# Patient Record
Sex: Male | Born: 1940 | Race: Black or African American | Hispanic: No | Marital: Single | Smoking: Former smoker
Health system: Southern US, Community
[De-identification: ages and names within clinical notes are randomized; demographics above are authoritative.]

## PROBLEM LIST (undated history)

## (undated) DIAGNOSIS — Z7901 Long term (current) use of anticoagulants: Secondary | ICD-10-CM

## (undated) DIAGNOSIS — I10 Essential (primary) hypertension: Secondary | ICD-10-CM

## (undated) DIAGNOSIS — Z5181 Encounter for therapeutic drug level monitoring: Secondary | ICD-10-CM

## (undated) DIAGNOSIS — D649 Anemia, unspecified: Secondary | ICD-10-CM

## (undated) DIAGNOSIS — F1410 Cocaine abuse, uncomplicated: Secondary | ICD-10-CM

## (undated) DIAGNOSIS — I480 Paroxysmal atrial fibrillation: Principal | ICD-10-CM

## (undated) DIAGNOSIS — Z0389 Encounter for observation for other suspected diseases and conditions ruled out: Secondary | ICD-10-CM

## (undated) DIAGNOSIS — Z720 Tobacco use: Secondary | ICD-10-CM

## (undated) DIAGNOSIS — I272 Pulmonary hypertension, unspecified: Secondary | ICD-10-CM

## (undated) HISTORY — DX: Encounter for observation for other suspected diseases and conditions ruled out: Z03.89

## (undated) HISTORY — DX: Pulmonary hypertension, unspecified: I27.20

## (undated) HISTORY — DX: Encounter for therapeutic drug level monitoring: Z51.81

## (undated) HISTORY — DX: Anemia, unspecified: D64.9

## (undated) HISTORY — DX: Tobacco use: Z72.0

## (undated) HISTORY — DX: Paroxysmal atrial fibrillation: I48.0

## (undated) HISTORY — DX: Long term (current) use of anticoagulants: Z79.01

## (undated) HISTORY — DX: Essential (primary) hypertension: I10

## (undated) HISTORY — DX: Cocaine abuse, uncomplicated: F14.10

---

## 2011-12-17 DEATH — deceased

## 2016-09-18 DIAGNOSIS — Z125 Encounter for screening for malignant neoplasm of prostate: Secondary | ICD-10-CM | POA: Diagnosis not present

## 2016-09-18 DIAGNOSIS — E782 Mixed hyperlipidemia: Secondary | ICD-10-CM | POA: Diagnosis not present

## 2016-09-18 DIAGNOSIS — I739 Peripheral vascular disease, unspecified: Secondary | ICD-10-CM | POA: Diagnosis not present

## 2016-09-18 DIAGNOSIS — E559 Vitamin D deficiency, unspecified: Secondary | ICD-10-CM | POA: Diagnosis not present

## 2016-09-18 DIAGNOSIS — I779 Disorder of arteries and arterioles, unspecified: Secondary | ICD-10-CM | POA: Diagnosis not present

## 2016-09-18 DIAGNOSIS — R739 Hyperglycemia, unspecified: Secondary | ICD-10-CM | POA: Diagnosis not present

## 2016-09-18 DIAGNOSIS — I4891 Unspecified atrial fibrillation: Secondary | ICD-10-CM | POA: Diagnosis not present

## 2016-09-18 DIAGNOSIS — I110 Hypertensive heart disease with heart failure: Secondary | ICD-10-CM | POA: Diagnosis not present

## 2016-09-18 DIAGNOSIS — I10 Essential (primary) hypertension: Secondary | ICD-10-CM | POA: Diagnosis not present

## 2016-09-18 DIAGNOSIS — R0602 Shortness of breath: Secondary | ICD-10-CM | POA: Diagnosis not present

## 2016-09-18 DIAGNOSIS — Z0000 Encounter for general adult medical examination without abnormal findings: Secondary | ICD-10-CM | POA: Diagnosis not present

## 2017-10-03 ENCOUNTER — Inpatient Hospital Stay (HOSPITAL_COMMUNITY)
Admission: EM | Admit: 2017-10-03 | Discharge: 2017-10-08 | DRG: 286 | Disposition: A | Discharge status: Home / Self Care | Payer: Medicare Other | Attending: Internal Medicine | Admitting: Internal Medicine

## 2017-10-03 ENCOUNTER — Emergency Department (HOSPITAL_COMMUNITY): Payer: Medicare Other

## 2017-10-03 ENCOUNTER — Encounter (HOSPITAL_COMMUNITY): Payer: Self-pay | Admitting: Emergency Medicine

## 2017-10-03 ENCOUNTER — Observation Stay (HOSPITAL_BASED_OUTPATIENT_CLINIC_OR_DEPARTMENT_OTHER): Payer: Medicare Other

## 2017-10-03 DIAGNOSIS — I4891 Unspecified atrial fibrillation: Secondary | ICD-10-CM | POA: Diagnosis not present

## 2017-10-03 DIAGNOSIS — R000 Tachycardia, unspecified: Secondary | ICD-10-CM | POA: Diagnosis not present

## 2017-10-03 DIAGNOSIS — I272 Pulmonary hypertension, unspecified: Secondary | ICD-10-CM | POA: Diagnosis present

## 2017-10-03 DIAGNOSIS — Z720 Tobacco use: Secondary | ICD-10-CM | POA: Diagnosis present

## 2017-10-03 DIAGNOSIS — Z820 Family history of epilepsy and other diseases of the nervous system: Secondary | ICD-10-CM

## 2017-10-03 DIAGNOSIS — I160 Hypertensive urgency: Secondary | ICD-10-CM | POA: Diagnosis not present

## 2017-10-03 DIAGNOSIS — R011 Cardiac murmur, unspecified: Secondary | ICD-10-CM | POA: Diagnosis present

## 2017-10-03 DIAGNOSIS — Z7901 Long term (current) use of anticoagulants: Secondary | ICD-10-CM

## 2017-10-03 DIAGNOSIS — I340 Nonrheumatic mitral (valve) insufficiency: Secondary | ICD-10-CM | POA: Diagnosis present

## 2017-10-03 DIAGNOSIS — N179 Acute kidney failure, unspecified: Secondary | ICD-10-CM | POA: Diagnosis present

## 2017-10-03 DIAGNOSIS — I361 Nonrheumatic tricuspid (valve) insufficiency: Secondary | ICD-10-CM | POA: Diagnosis not present

## 2017-10-03 DIAGNOSIS — I481 Persistent atrial fibrillation: Principal | ICD-10-CM | POA: Diagnosis present

## 2017-10-03 DIAGNOSIS — F17210 Nicotine dependence, cigarettes, uncomplicated: Secondary | ICD-10-CM | POA: Diagnosis present

## 2017-10-03 DIAGNOSIS — N289 Disorder of kidney and ureter, unspecified: Secondary | ICD-10-CM

## 2017-10-03 DIAGNOSIS — D649 Anemia, unspecified: Secondary | ICD-10-CM

## 2017-10-03 DIAGNOSIS — I5043 Acute on chronic combined systolic (congestive) and diastolic (congestive) heart failure: Secondary | ICD-10-CM | POA: Diagnosis present

## 2017-10-03 DIAGNOSIS — F1410 Cocaine abuse, uncomplicated: Secondary | ICD-10-CM | POA: Diagnosis present

## 2017-10-03 DIAGNOSIS — I509 Heart failure, unspecified: Secondary | ICD-10-CM

## 2017-10-03 DIAGNOSIS — Z8249 Family history of ischemic heart disease and other diseases of the circulatory system: Secondary | ICD-10-CM

## 2017-10-03 DIAGNOSIS — I110 Hypertensive heart disease with heart failure: Secondary | ICD-10-CM | POA: Diagnosis present

## 2017-10-03 DIAGNOSIS — IMO0001 Reserved for inherently not codable concepts without codable children: Secondary | ICD-10-CM

## 2017-10-03 DIAGNOSIS — R0602 Shortness of breath: Secondary | ICD-10-CM | POA: Diagnosis not present

## 2017-10-03 DIAGNOSIS — Z9114 Patient's other noncompliance with medication regimen: Secondary | ICD-10-CM

## 2017-10-03 DIAGNOSIS — R05 Cough: Secondary | ICD-10-CM | POA: Diagnosis not present

## 2017-10-03 DIAGNOSIS — Z0389 Encounter for observation for other suspected diseases and conditions ruled out: Secondary | ICD-10-CM

## 2017-10-03 DIAGNOSIS — R7303 Prediabetes: Secondary | ICD-10-CM | POA: Diagnosis present

## 2017-10-03 HISTORY — DX: Tobacco use: Z72.0

## 2017-10-03 HISTORY — DX: Anemia, unspecified: D64.9

## 2017-10-03 HISTORY — DX: Essential (primary) hypertension: I10

## 2017-10-03 LAB — TSH: TSH: 1.275 u[IU]/mL (ref 0.350–4.500)

## 2017-10-03 LAB — BASIC METABOLIC PANEL
ANION GAP: 10 (ref 5–15)
BUN: 15 mg/dL (ref 6–20)
CHLORIDE: 106 mmol/L (ref 101–111)
CO2: 25 mmol/L (ref 22–32)
Calcium: 8.9 mg/dL (ref 8.9–10.3)
Creatinine, Ser: 1.49 mg/dL — ABNORMAL HIGH (ref 0.61–1.24)
GFR calc non Af Amer: 44 mL/min — ABNORMAL LOW (ref >60)
GFR, EST AFRICAN AMERICAN: 52 mL/min — AB (ref >60)
Glucose, Bld: 137 mg/dL — ABNORMAL HIGH (ref 65–99)
POTASSIUM: 3.2 mmol/L — AB (ref 3.5–5.1)
Sodium: 141 mmol/L (ref 135–145)

## 2017-10-03 LAB — ECHOCARDIOGRAM COMPLETE
Ao-asc: 32 cm
FS: 20 % — AB (ref 28–44)
HEIGHTINCHES: 73 in
IVS/LV PW RATIO, ED: 1
LA ID, A-P, ES: 51 mm
LA diam index: 2.28 cm/m2
LA vol A4C: 88.5 ml
LA vol: 94.6 mL
LAVOLIN: 42.2 mL/m2
LEFT ATRIUM END SYS DIAM: 51 mm
LVOT area: 3.46 cm2
LVOT diameter: 21 mm
PW: 15 mm — AB (ref 0.6–1.1)
Weight: 3520 oz

## 2017-10-03 LAB — RAPID URINE DRUG SCREEN, HOSP PERFORMED
AMPHETAMINES: NOT DETECTED
BARBITURATES: NOT DETECTED
BENZODIAZEPINES: NOT DETECTED
COCAINE: POSITIVE — AB
Opiates: NOT DETECTED
TETRAHYDROCANNABINOL: NOT DETECTED

## 2017-10-03 LAB — CBC
HEMATOCRIT: 39 % (ref 39.0–52.0)
Hemoglobin: 12.3 g/dL — ABNORMAL LOW (ref 13.0–17.0)
MCH: 27 pg (ref 26.0–34.0)
MCHC: 31.5 g/dL (ref 30.0–36.0)
MCV: 85.7 fL (ref 78.0–100.0)
PLATELETS: 305 10*3/uL (ref 150–400)
RBC: 4.55 MIL/uL (ref 4.22–5.81)
RDW: 14.5 % (ref 11.5–15.5)
WBC: 6.5 10*3/uL (ref 4.0–10.5)

## 2017-10-03 LAB — MAGNESIUM: Magnesium: 1.7 mg/dL (ref 1.7–2.4)

## 2017-10-03 LAB — TROPONIN I
TROPONIN I: 0.17 ng/mL — AB (ref <0.03)
TROPONIN I: 0.2 ng/mL — AB (ref <0.03)
Troponin I: 0.16 ng/mL (ref <0.03)
Troponin I: 0.19 ng/mL (ref <0.03)

## 2017-10-03 LAB — HEPARIN LEVEL (UNFRACTIONATED)
HEPARIN UNFRACTIONATED: 0.13 [IU]/mL — AB (ref 0.30–0.70)
Heparin Unfractionated: 0.15 IU/mL — ABNORMAL LOW (ref 0.30–0.70)

## 2017-10-03 LAB — URINALYSIS, ROUTINE W REFLEX MICROSCOPIC
Bilirubin Urine: NEGATIVE
Glucose, UA: NEGATIVE mg/dL
Hgb urine dipstick: NEGATIVE
Ketones, ur: NEGATIVE mg/dL
Leukocytes, UA: NEGATIVE
NITRITE: NEGATIVE
PH: 6 (ref 5.0–8.0)
Protein, ur: NEGATIVE mg/dL
SPECIFIC GRAVITY, URINE: 1.005 (ref 1.005–1.030)

## 2017-10-03 LAB — HEMOGLOBIN A1C
Hgb A1c MFr Bld: 6 % — ABNORMAL HIGH (ref 4.8–5.6)
Mean Plasma Glucose: 125.5 mg/dL

## 2017-10-03 LAB — APTT: aPTT: 28 seconds (ref 24–36)

## 2017-10-03 LAB — PROTIME-INR
INR: 1.2
PROTHROMBIN TIME: 15.1 s (ref 11.4–15.2)

## 2017-10-03 LAB — BRAIN NATRIURETIC PEPTIDE: B Natriuretic Peptide: 614.9 pg/mL — ABNORMAL HIGH (ref 0.0–100.0)

## 2017-10-03 MED ORDER — DILTIAZEM HCL 100 MG IV SOLR
5.0000 mg/h | INTRAVENOUS | Status: DC
  Administered 2017-10-03: 5 mg/h via INTRAVENOUS
  Filled 2017-10-03: qty 100

## 2017-10-03 MED ORDER — DILTIAZEM HCL 30 MG PO TABS
30.0000 mg | ORAL_TABLET | Freq: Once | ORAL | Status: AC
  Administered 2017-10-03: 30 mg via ORAL
  Filled 2017-10-03: qty 1

## 2017-10-03 MED ORDER — HYDRALAZINE HCL 20 MG/ML IJ SOLN
10.0000 mg | INTRAMUSCULAR | Status: DC | PRN
  Administered 2017-10-03 – 2017-10-06 (×5): 10 mg via INTRAVENOUS
  Filled 2017-10-03 (×6): qty 1

## 2017-10-03 MED ORDER — ACETAMINOPHEN 650 MG RE SUPP
650.0000 mg | Freq: Four times a day (QID) | RECTAL | Status: DC | PRN
Start: 2017-10-03 — End: 2017-10-08

## 2017-10-03 MED ORDER — FUROSEMIDE 10 MG/ML IJ SOLN
40.0000 mg | Freq: Two times a day (BID) | INTRAMUSCULAR | Status: DC
  Administered 2017-10-03 – 2017-10-04 (×3): 40 mg via INTRAVENOUS
  Filled 2017-10-03 (×3): qty 4

## 2017-10-03 MED ORDER — ONDANSETRON HCL 4 MG/2ML IJ SOLN: 4.0000 mg | Freq: Four times a day (QID) | INTRAMUSCULAR | Status: DC | PRN

## 2017-10-03 MED ORDER — POTASSIUM CHLORIDE CRYS ER 20 MEQ PO TBCR
40.0000 meq | EXTENDED_RELEASE_TABLET | Freq: Once | ORAL | Status: AC
  Administered 2017-10-03: 20 meq via ORAL
  Filled 2017-10-03: qty 2

## 2017-10-03 MED ORDER — ACETAMINOPHEN 325 MG PO TABS
650.0000 mg | ORAL_TABLET | Freq: Four times a day (QID) | ORAL | Status: DC | PRN
Start: 2017-10-03 — End: 2017-10-08

## 2017-10-03 MED ORDER — POTASSIUM CHLORIDE CRYS ER 20 MEQ PO TBCR
40.0000 meq | EXTENDED_RELEASE_TABLET | Freq: Two times a day (BID) | ORAL | Status: AC
  Administered 2017-10-03 (×2): 40 meq via ORAL
  Filled 2017-10-03 (×2): qty 2

## 2017-10-03 MED ORDER — HEPARIN (PORCINE) IN NACL 100-0.45 UNIT/ML-% IJ SOLN
1600.0000 [IU]/h | INTRAMUSCULAR | Status: DC
  Administered 2017-10-03: 1000 [IU]/h via INTRAVENOUS
  Administered 2017-10-03: 1200 [IU]/h via INTRAVENOUS
  Administered 2017-10-05 (×2): 1700 [IU]/h via INTRAVENOUS
  Administered 2017-10-06: 1600 [IU]/h via INTRAVENOUS
  Filled 2017-10-03 (×6): qty 250

## 2017-10-03 MED ORDER — ONDANSETRON HCL 4 MG PO TABS
4.0000 mg | ORAL_TABLET | Freq: Four times a day (QID) | ORAL | Status: DC | PRN
Start: 2017-10-03 — End: 2017-10-08

## 2017-10-03 MED ORDER — DILTIAZEM HCL 60 MG PO TABS
60.0000 mg | ORAL_TABLET | Freq: Two times a day (BID) | ORAL | Status: DC
  Administered 2017-10-03 – 2017-10-07 (×9): 60 mg via ORAL
  Filled 2017-10-03 (×10): qty 1

## 2017-10-03 MED ORDER — DILTIAZEM LOAD VIA INFUSION
20.0000 mg | Freq: Once | INTRAVENOUS | Status: AC
  Administered 2017-10-03: 20 mg via INTRAVENOUS
  Filled 2017-10-03: qty 20

## 2017-10-03 MED ORDER — HEPARIN (PORCINE) IN NACL 100-0.45 UNIT/ML-% IJ SOLN: 1000.0000 [IU]/h | INTRAMUSCULAR | Status: DC

## 2017-10-03 MED ORDER — HEPARIN BOLUS VIA INFUSION
4000.0000 [IU] | Freq: Once | INTRAVENOUS | Status: AC
  Administered 2017-10-03: 4000 [IU] via INTRAVENOUS
  Filled 2017-10-03: qty 4000

## 2017-10-03 MED ORDER — FUROSEMIDE 10 MG/ML IJ SOLN
20.0000 mg | Freq: Once | INTRAMUSCULAR | Status: AC
  Administered 2017-10-03: 20 mg via INTRAVENOUS
  Filled 2017-10-03: qty 2

## 2017-10-03 NOTE — Consult Note (Signed)
Cardiology Consultation:   Patient ID: Adam Willis; [id number]; 02-25-1941   Admit date: 10/03/2017 Date of Consult: 10/03/2017  Primary Care Provider: [facility], [facility] Primary Cardiologist: Adam Willis Primary Electrophysiologist:  n/a   Patient Profile:   Adam Willis is a 76 y.o. male with a hx of murmur, HTN who is being seen today for the evaluation of SOB and atrial fib at the request of Dr Adam Willis.  History of Present Illness:   Adam Willis was in his usual state of health until about 2 weeks ago.  He started noticing increased dyspnea on exertion. He would also noticed that when he was exerting himself, his heart rate was elevated.   He did not have any chest pain. He has no history of chest pain. He has never had palpitations or dyspnea on exertion like this before. He has no bleeding issues. He does keep appointments with his PCP, but has not had a checkup this year. He does not know when he last had fasting labs done.  He has high blood pressure and several family members have/had high blood pressure, but he does not know what his blood pressure runs at home. He has not been having headaches or visual disturbances.  At rest, he would did not notice an irregular heartbeat. He did not have lower extremity edema. He has some mild orthopnea but no PND. He would rest and try to calm himself down and he would feel his heart rate and breathing improve. He did not have presyncope or syncope.  The symptoms gradually worsened and he finally came to the hospital this a.m.  n the ER, he was in heart failure and rapid atrial fibrillation. He was started on IV Cardizem with improvement in his heart rate. Once his heart rate improved, he was changed to oral Cardizem area of his heart rate is currently controlled.  He was given Lasix 20 mg IV and started on oxygen. His breathing has improved.  He was significantly hypertensive on admission with a blood pressure of  202/121. In addition to the Cardizem, he was given hydralazine 10 mg. His blood pressure is elevated but improved.  He is on heparin currently for anticoagulation. He has Medicare but does not have a part D and does not have prescription coverage.   Past Medical History:  Diagnosis Date  . Heart murmur   . Hypertension     History reviewed. No pertinent surgical history.   Inpatient Medications: Scheduled Meds: . diltiazem  60 mg Oral Q12H   Continuous Infusions: . heparin 1,000 Units/hr (10/03/17 0234)   PRN Meds: acetaminophen **OR** acetaminophen, hydrALAZINE, ondansetron **OR** ondansetron (ZOFRAN) IV Prior to Admission medications   Medication Sig Start Date End Date Taking? Authorizing Provider  amLODipine (NORVASC) 5 MG tablet Take 5 mg by mouth daily.   Yes [provider]  aspirin 325 MG tablet Take 325 mg by mouth daily.   Yes [provider]  atorvastatin (LIPITOR) 20 MG tablet Take 20 mg by mouth daily.   Yes [provider]  carvedilol (COREG) 6.25 MG tablet Take 6.25 mg by mouth 2 (two) times daily with a meal.   Yes [provider]  famotidine (PEPCID AC) 10 MG chewable tablet Chew 10 mg by mouth 2 (two) times daily.   Yes [provider]  lisinopril (PRINIVIL,ZESTRIL) 20 MG tablet Take 20 mg by mouth daily.   Yes [provider]    Allergies:   No Known Allergies  Social  History:   Social History   Social History  . Marital status: Single    Spouse name: N/A  . Number of children: N/A  . Years of education: N/A   Occupational History  . 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 Smoking status: Current Every Day Smoker    Types: Cigarettes  . Smokeless tobacco: Never Used     Comment: cut back to 1-2/day in 2016  . Alcohol use No  . Drug use: No  . Sexual activity: Not on file   Other Topics Concern  . Not on file   Social History Narrative   [place] He has a fiance.    Family History:   The patient's family history includes Alzheimer's disease (age of onset: 5886) in his mother; CAD (age of onset: 6470) in his father; Hypertension in his brother. Pt indicated that his mother is deceased. He indicated that his father is deceased. He indicated that his brother is alive.    ROS:  Please see the history of present illness.  All other ROS reviewed and negative.      Physical Exam/Data:   Vitals:   10/03/17 0900 10/03/17 0930 10/03/17 1000 10/03/17 1030  BP: (!) 141/78 (!) 145/94 (!) 164/91 (!) 163/94  Pulse: 66 (!) 48 (!) 57 63  Resp: (!) 29 20 19 20   SpO2: 98% 97% 98% 98%  Weight:      Height:       No intake or output data in the 24 hours ending 10/03/17 1133 Filed Weights   10/03/17 0111  Weight: 220 lb (99.8 kg)   Body mass index is 29.03 kg/m.  General:  Well nourished, well developed, in no acute distress HEENT: normal Lymph: no adenopathy Neck: JVD to jaw Endocrine:  No thryomegaly Vascular: No carotid bruits; 4/4 extremity pulses 2+ bilaterally  Cardiac:  normal S1, S2; RRR; soft systolic murmur  Lungs:  Good air exchange with basilar Rales bilaterally, no wheezing, rhonchi  Abd: soft, nontender, no hepatomegaly  Ext: no edema Musculoskeletal:  No deformities, BUE and BLE strength normal and equal Skin: warm and dry  Neuro:  CNs 2-12 intact, no focal abnormalities noted Psych:  Normal affect   EKG:  The EKG was personally reviewed and demonstrates:  10/19 at 0 1:12 AM ECG is atrial fibrillation with rapid ventricular response, heart rate 123; repeat ECG at 3:35 AM his atrial fibrillation, heart rate 51, LVH Telemetry:  Telemetry was personally reviewed and demonstrates:  Atrial fib, heart rate currently controlled  Relevant CV Studies:  Echo ordered  Laboratory Data:  Chemistry  Recent Labs Lab 10/03/17 0115  NA 141  K 3.2*  CL 106  CO2 25  GLUCOSE 137*  BUN 15  CREATININE 1.49*    CALCIUM 8.9  GFRNONAA 44*  GFRAA 52*  ANIONGAP 10    Hematology  Recent Labs Lab 10/03/17 0115  WBC 6.5  RBC 4.55  HGB 12.3*  HCT 39.0  MCV 85.7  MCH 27.0  MCHC 31.5  RDW 14.5  PLT 305   Cardiac Enzymes  Recent Labs Lab 10/03/17 0115 10/03/17 0536  TROPONINI 0.16* 0.17*     BNP  Recent Labs Lab 10/03/17 0115  BNP 614.9*     Radiology/Studies:  Dg Chest Port 1 View  Result Date: 10/03/2017 CLINICAL DATA:  76 y/o M; history fibrillation, shortness of breath, dry cough, chest congestion. EXAM: PORTABLE CHEST 1  VIEW COMPARISON:  None. FINDINGS: Enlarged cardiac silhouette. Transcutaneous pacing pads noted. Reticular markings and peripheral lines compatible with interstitial pulmonary edema. No consolidation, effusion, or pneumothorax identified. Bones are unremarkable. IMPRESSION: Enlarged cardiac silhouette.  Interstitial pulmonary edema. Electronically Signed   By: Mitzi Hansen M.D.   On: 10/03/2017 01:28    Assessment and Plan:   Principal Problem: 1.  Atrial fibrillation with RVR (HCC) - heart rate improved on Cardizem, but if EF is down, will need to consider changing this to a beta blocker - Duration is at least a week, if cardioversion is indicated, we'll have to do TEE first - When heart rate is controlled, he is asymptomatic. Continue rate control for now  Active Problems: 2.  Acute CHF (congestive heart failure) (HCC) - echocardiogram ordered. - He needs additional diuresis, we'll have to follow his renal function carefully - f/u on echo  3.  Acute renal failure (ARF) (HCC) - per IM - no labs in the system, nothing in Care Everywhere. - He has not had recent labs drawn. - he was not aware of any problem with his renal function  4.  Normocytic normochromic anemia - No history of bleeding per patient - Per IM  5.  Tobacco abuse - Per IM, cessation encouraged  6.  Hypertensive urgency - Per IM, improved  7. Anticoagulation: -  This patients CHA2DS2-VASc Score and unadjusted Ischemic Stroke Rate (% per year) is equal to 3.2 % stroke rate/year from a score of 3 Above score calculated as 1 point each if present [CHF, HTN, DM, Vascular=MI/PAD/Aortic Plaque, Age if 65-74, or Male], 2 points each if present [Age > 75, or Stroke/TIA/TE] - Currently on heparin, changed to Eliquis when we are sure there is no need for invasive workup  8. Elevated troponin - Mild elevation in troponin with no significant trend. - this is more consistent with CHF than ACS - continue to cycle   Signed, Theodore Demark, PA-C  10/03/2017 11:33 AM

## 2017-10-03 NOTE — Progress Notes (Signed)
ANTICOAGULATION CONSULT NOTE  Pharmacy Consult for heparin Indication: atrial fibrillation  No Known Allergies  Patient Measurements: Height: 6\' 3"  (190.5 cm) Weight: 225 lb 4.8 oz (102.2 kg) IBW/kg (Calculated) : 84.5  Vital Signs: Temp: 98.7 F (37.1 C) (10/19 2142) Temp Source: Oral (10/19 2142) BP: 158/94 (10/19 2142) Pulse Rate: 82 (10/19 2142)  Labs:  Recent Labs  10/03/17 0115 10/03/17 0122 10/03/17 0536 10/03/17 1124 10/03/17 1756 10/03/17 2119  HGB 12.3*  --   --   --   --   --   HCT 39.0  --   --   --   --   --   PLT 305  --   --   --   --   --   APTT  --  28  --   --   --   --   LABPROT  --  15.1  --   --   --   --   INR  --  1.20  --   --   --   --   HEPARINUNFRC  --   --   --  0.13*  --  0.15*  CREATININE 1.49*  --   --   --   --   --   TROPONINI 0.16*  --  0.17* 0.20* 0.19*  --     Assessment: 76yo male with new onset atrial fibrillation on IV heparin per pharmacy dosing consult.   Heparin level remains sub-therapeutic at 0.15 on 1200 units/hr.  No bleeding reported. No issues with line noted.   Goal of Therapy:  Heparin level 0.3-0.7 units/ml Monitor platelets by anticoagulation protocol: Yes   Plan:  Increase heparin to 1500 units/hr Daily heparin level and CBC Follow for s/s bleeding and cards plans- plans for change to Eliquis when no further invasive workup indicated  Link Snuffer, PharmD, BCPS Clinical Pharmacist Clinical phone 10/03/2017 until 11PM - [phone] After hours, please call #[phone] 10/03/2017 10:20 PM

## 2017-10-03 NOTE — ED Notes (Addendum)
Dr. Rhunette Croft discontinued BIPAP , pt. placed on 4 LPM/ , pt. waiting for admitting MD . Denies chest pain /respirations unlabored . Heparin drip infusing .

## 2017-10-03 NOTE — ED Triage Notes (Addendum)
Patient arrived with EMS from home reports worsening SOB , dry cough and chest congestion onset yesterday afternoon . He received 2 doses of Duoneb treatment by EMS prior to arrival with no relief . Pt. is hypertensive and has not taken his antihypertensive medications for several weeks .

## 2017-10-03 NOTE — Progress Notes (Signed)
  Echocardiogram 2D Echocardiogram has been performed.  Adam Willis 10/03/2017, 12:41 PM

## 2017-10-03 NOTE — ED Provider Notes (Signed)
[facility] EMERGENCY DEPARTMENT Provider Note   CSN: [id number] Arrival date & time: 10/03/17  0110     History   Chief Complaint Chief Complaint  Patient presents with  . Shortness of Breath    HPI Adam Willis is a 76 y.o. male.  HPI Level 5 caveat for respiratory distress. Patient with history of high blood pressure, heart murmur and noncompliance with his blood pressure medicine for the past 1 week comes in with chief complaint of acute shortness of breath.  Patient reports that he started having some shortness of breath this afternoon that progressed overnight.  Patient called EMS who noted some wheezing initially and rales in route.  Patient was given DuoNeb and started on BiPAP.  Patient is noted to be tachycardic and in A. Fib.  He is also hypertensive.  Patient denies any history of A. fib and he has no active chest pain.  Upon further questioning patient reports that he is noting some palpitations.  Patient thinks that he has had some exertional dyspnea for 1 week and intermittently noted some palpitations as well.  Patient denies any history of PE/DVT and has no risk factors for it.  Patient denies heavy smoking or any drug use.  Past Medical History:  Diagnosis Date  . Heart murmur   . Hypertension     Patient Active Problem List   Diagnosis Date Noted  . Atrial fibrillation with RVR (HCC) 10/03/2017  . Acute CHF (congestive heart failure) (HCC) 10/03/2017  . Acute renal failure (ARF) (HCC) 10/03/2017  . Normocytic normochromic anemia 10/03/2017  . Tobacco abuse 10/03/2017  . Hypertensive urgency 10/03/2017    History reviewed. No pertinent surgical history.     Home Medications    Prior to Admission medications   Not on File    Family History Family History  Problem Relation Age of Onset  . Hypertension Brother     Social History Social History  Substance Use Topics  . Smoking status: Never Smoker  . Smokeless  tobacco: Never Used  . Alcohol use No     Allergies   Patient has no known allergies.   Review of Systems Review of Systems  Unable to perform ROS: Severe respiratory distress     Physical Exam Updated Vital Signs BP (!) 161/101   Pulse 68   Resp 17   Ht 6\' 1"  (1.854 m)   Wt 99.8 kg (220 lb)   SpO2 98%   BMI 29.03 kg/m   Physical Exam  Constitutional: He is oriented to person, place, and time. He appears well-developed.  HENT:  Head: Atraumatic.  Neck: Neck supple.  Cardiovascular: Intact distal pulses.   Tachycardia, irregular  Pulmonary/Chest: Effort normal. He has rales.  Abdominal: Soft.  Musculoskeletal: He exhibits edema. He exhibits no tenderness.  Neurological: He is alert and oriented to person, place, and time.  Skin: Skin is warm.  Nursing note and vitals reviewed.    ED Treatments / Results  Labs (all labs ordered are listed, but only abnormal results are displayed) Labs Reviewed  BASIC METABOLIC PANEL - Abnormal; Notable for the following:       Result Value   Potassium 3.2 (*)    Glucose, Bld 137 (*)    Creatinine, Ser 1.49 (*)    GFR calc non Af Amer 44 (*)    GFR calc Af Amer 52 (*)    All other components within normal limits  CBC - Abnormal; Notable for the following:  Hemoglobin 12.3 (*)    All other components within normal limits  TROPONIN I - Abnormal; Notable for the following:    Troponin I 0.16 (*)    All other components within normal limits  BRAIN NATRIURETIC PEPTIDE - Abnormal; Notable for the following:    B Natriuretic Peptide 614.9 (*)    All other components within normal limits  TROPONIN I - Abnormal; Notable for the following:    Troponin I 0.17 (*)    All other components within normal limits  HEMOGLOBIN A1C - Abnormal; Notable for the following:    Hgb A1c MFr Bld 6.0 (*)    All other components within normal limits  MAGNESIUM  PROTIME-INR  APTT  TSH  HEPARIN LEVEL (UNFRACTIONATED)  TROPONIN I  TROPONIN I   URINALYSIS, ROUTINE W REFLEX MICROSCOPIC  RAPID URINE DRUG SCREEN, HOSP PERFORMED    EKG  EKG Interpretation  Date/Time:  Friday October 03 2017 01:12:24 EDT Ventricular Rate:  123 PR Interval:    QRS Duration: 97 QT Interval:  334 QTC Calculation: 478 R Axis:   86 Text Interpretation:  Atrial fibrillation Borderline right axis deviation Probable left ventricular hypertrophy Borderline prolonged QT interval No acute changes AFIB IS NEW - but no old ekg to compare Confirmed by Derwood Kaplananavati, Nickey Canedo ([id number]) on 10/03/2017 1:57:56 AM       Radiology Dg Chest Port 1 View  Result Date: 10/03/2017 CLINICAL DATA:  76 y/o M; history fibrillation, shortness of breath, dry cough, chest congestion. EXAM: PORTABLE CHEST 1 VIEW COMPARISON:  None. FINDINGS: Enlarged cardiac silhouette. Transcutaneous pacing pads noted. Reticular markings and peripheral lines compatible with interstitial pulmonary edema. No consolidation, effusion, or pneumothorax identified. Bones are unremarkable. IMPRESSION: Enlarged cardiac silhouette.  Interstitial pulmonary edema. Electronically Signed   By: Mitzi HansenLance  Furusawa-Stratton M.D.   On: 10/03/2017 01:28    Procedures Procedures (including critical care time) CRITICAL CARE Performed by: Derwood KaplanNanavati, Shayma Pfefferle   Total critical care time: 53 minutes  Critical care time was exclusive of separately billable procedures and treating other patients.  Critical care was necessary to treat or prevent imminent or life-threatening deterioration.  Critical care was time spent personally by me on the following activities: development of treatment plan with patient and/or surrogate as well as nursing, discussions with consultants, evaluation of patient's response to treatment, examination of patient, obtaining history from patient or surrogate, ordering and performing treatments and interventions, ordering and review of laboratory studies, ordering and review of radiographic studies, pulse  oximetry and re-evaluation of patient's condition.\  Medications Ordered in ED Medications  heparin ADULT infusion 100 units/mL (25000 units/28450mL sodium chloride 0.45%) (1,000 Units/hr Intravenous New Bag/Given 10/03/17 0234)  diltiazem (CARDIZEM) tablet 60 mg (not administered)  acetaminophen (TYLENOL) tablet 650 mg (not administered)    Or  acetaminophen (TYLENOL) suppository 650 mg (not administered)  ondansetron (ZOFRAN) tablet 4 mg (not administered)    Or  ondansetron (ZOFRAN) injection 4 mg (not administered)  hydrALAZINE (APRESOLINE) injection 10 mg (10 mg Intravenous Given 10/03/17 0652)  diltiazem (CARDIZEM) 1 mg/mL load via infusion 20 mg (20 mg Intravenous Bolus from Bag 10/03/17 0158)  heparin bolus via infusion 4,000 Units (4,000 Units Intravenous Bolus from Bag 10/03/17 0234)  diltiazem (CARDIZEM) tablet 30 mg (30 mg Oral Given 10/03/17 0404)  furosemide (LASIX) injection 20 mg (20 mg Intravenous Given 10/03/17 0622)  potassium chloride SA (K-DUR,KLOR-CON) CR tablet 40 mEq (20 mEq Oral Given 10/03/17 0622)     Initial Impression / Assessment and Plan /  ED Course  I have reviewed the triage vital signs and the nursing notes.  Pertinent labs & imaging results that were available during my care of the patient were reviewed by me and considered in my medical decision making (see chart for details).  Clinical Course as of Oct 03 806  Fri Oct 03, 2017  0350 Both the heart rate and the blood pressure have improved.  Diltiazem drip will be discontinued and patient will be given an oral diltiazem pill.  We will also discontinue the BiPAP shortly.  [AN]    Clinical Course User Index [AN] Derwood Kaplan, MD    Patient comes in with chief complaint of shortness of breath.  Patient is noted to be in A. fib with RVR.  He has history of high blood pressure, A. fib appears to be new.  Based on history it is unclear as to the onset of A. fib, and it is possible that patient has  been in A. fib for close to a week now -since he explains that he has been having exertional dyspnea and some palpitations for a week now.  At arrival patient is on BiPAP, and he is in decompensated CHF.  With BiPAP patient felt a lot better, and so we decided to treat patient with diltiazem drip.  Over time patient's heart rate improved, diltiazem drip was stopped and patient was given oral dilt.  This patients CHA2DS2-VASc Score and unadjusted Ischemic Stroke Rate (% per year) is equal to 2.2 % stroke rate/year from a score of 2  Above score calculated as 1 point each if present [CHF, HTN, DM, Vascular=MI/PAD/Aortic Plaque, Age if 65-74, or Male] Above score calculated as 2 points each if present [Age > 75, or Stroke/TIA/TE]  Heparin drip initiated.  Cardiology on consult, medicine to admit.  Final Clinical Impressions(s) / ED Diagnoses   Final diagnoses:  Atrial fibrillation with RVR (HCC)  Acute congestive heart failure, unspecified heart failure type [facility])    New Prescriptions New Prescriptions   No medications on file     Derwood Kaplan, MD 10/03/17 [phone]

## 2017-10-03 NOTE — ED Notes (Signed)
Called lab to add on BNP and INR.

## 2017-10-03 NOTE — Progress Notes (Signed)
ANTICOAGULATION CONSULT NOTE - Initial Consult  Pharmacy Consult for heparin Indication: atrial fibrillation  No Known Allergies  Patient Measurements: Height: 6\' 1"  (185.4 cm) Weight: 220 lb (99.8 kg) IBW/kg (Calculated) : 79.9  Vital Signs: BP: 172/129 (10/19 0200) Pulse Rate: 76 (10/19 0200)  Labs:  Recent Labs  10/03/17 0115  HGB 12.3*  HCT 39.0  PLT 305    Medical History: Past Medical History:  Diagnosis Date  . Heart murmur   . Hypertension     Assessment: 76yo male c/o worsening SOB, dry cough, and chest congestion since yesterday, EMS gave DuoNeb treatments w/ no relief, found to be in Afib, to begin heparin.  Goal of Therapy:  Heparin level 0.3-0.7 units/ml Monitor platelets by anticoagulation protocol: Yes   Plan:  Will give heparin 4000 units IV bolus x1 followed by gtt at 1000 units/hr and monitor heparin levels and CBC.  Vernard Gambles, PharmD, BCPS  10/03/2017,2:09 AM

## 2017-10-03 NOTE — ED Notes (Signed)
Dr. Toniann Fail ( hospitalist) at bedside evaluating pt.

## 2017-10-03 NOTE — Progress Notes (Signed)
ANTICOAGULATION CONSULT NOTE  Pharmacy Consult for heparin Indication: atrial fibrillation  No Known Allergies  Patient Measurements: Height: 6\' 1"  (185.4 cm) Weight: 220 lb (99.8 kg) IBW/kg (Calculated) : 79.9  Vital Signs: BP: 156/92 (10/19 1100) Pulse Rate: 74 (10/19 1100)  Labs:  Recent Labs  10/03/17 0115 10/03/17 0122 10/03/17 0536 10/03/17 1124  HGB 12.3*  --   --   --   HCT 39.0  --   --   --   PLT 305  --   --   --   APTT  --  28  --   --   LABPROT  --  15.1  --   --   INR  --  1.20  --   --   HEPARINUNFRC  --   --   --  0.13*  CREATININE 1.49*  --   --   --   TROPONINI 0.16*  --  0.17* 0.20*    Assessment: 76yo male c/o worsening SOB, dry cough, and chest congestion since yesterday, EMS gave DuoNeb treatments w/ no relief, found to be in Afib, to begin heparin.  Initial heparin level low at 0.13units/mL. No issues with line per RN Shanda Bumps.  Goal of Therapy:  Heparin level 0.3-0.7 units/ml Monitor platelets by anticoagulation protocol: Yes   Plan:  Increase heparin to 1200 units/hr Heparin level in 8 hours Daily heparin level and CBC Follow for s/s bleeding and cards plans- plans for change to Eliquis when no further invasive workup indicated  Legacy Lacivita D. Kolleen [facility], PharmD, BCPS Clinical Pharmacist Pager: [phone] Clinical Phone for 10/03/2017 until 3:30pm: [phone] If after 3:30pm, please call main pharmacy at [phone] 10/03/2017 12:37 PM

## 2017-10-03 NOTE — ED Notes (Signed)
Patient given Pottasium 40 meq po .

## 2017-10-03 NOTE — Care Management Obs Status (Signed)
MEDICARE OBSERVATION STATUS NOTIFICATION   Patient Details  Name: Adam Willis MRN: [id number] Date of Birth: 21-Nov-1941   Medicare Observation Status Notification Given:  Yes    Lawerance Sabal, RN 10/03/2017, 1:44 PM

## 2017-10-03 NOTE — H&P (Signed)
History and Physical    Adam CedarKenneth Petruska LKG:[id number] DOB: [birth year] DOA: 10/03/2017  PCP: [facility]  Patient coming from: Home.  Chief Complaint: Shortness of breath.  HPI: Adam Willis is a 76 y.o. male with history of hypertension and possible CHF presents with the complaints of increasing shortness of breath and palpitations.  Patient states over the last 1 week patient has been having increasing exertional shortness of breath but he denies any chest pain.  Last week he had an episode of palpitation which resolved without any intervention.  ED Course: In the ER patient was found to be hypertensive with blood pressure systolic more than 200.  Patient was in A. fib with RVR.  Chest x-ray shows congestion and enlarged cardiac silhouette.  BNP was 614 and troponin was 0.16.  Patient was started on Cardizem infusion heparin and BiPAP.  Patient converted back to sinus rhythm with shortness of breath improving on BiPAP was removed.  The patient is being admitted for further observation.  Review of Systems: As per HPI, rest all negative.   Past Medical History:  Diagnosis Date  . Heart murmur   . Hypertension     History reviewed. No pertinent surgical history.   reports that he has never smoked. He has never used smokeless tobacco. He reports that he does not drink alcohol or use drugs.  No Known Allergies  Family History  Problem Relation Age of Onset  . Hypertension Brother     Prior to Admission medications   Not on File    Physical Exam: Vitals:   10/03/17 0530 10/03/17 0545 10/03/17 0600 10/03/17 0615  BP: (!) 150/95 (!) 173/119 (!) 148/93 (!) 161/93  Pulse: (!) 42 60 (!) 51 (!) 59  Resp: 20 (!) 24 16 20   SpO2: 99% 99% 98% 98%  Weight:      Height:          Constitutional: Moderately built and nourished. Vitals:   10/03/17 0530 10/03/17 0545 10/03/17 0600 10/03/17 0615  BP: (!) 150/95 (!) 173/119 (!) 148/93 (!) 161/93  Pulse: (!) 42 60 (!)  51 (!) 59  Resp: 20 (!) 24 16 20   SpO2: 99% 99% 98% 98%  Weight:      Height:       Eyes: Anicteric no pallor. ENMT: No discharge from the ears eyes nose and mouth. Neck: JVD mildly elevated no mass felt. Respiratory: Mild expiratory wheezes and crepitations. Cardiovascular: S1-S2 heard no murmurs appreciated. Abdomen: Soft nontender bowel sounds present. Musculoskeletal: No edema. Skin: No rash. Neurologic: Alert awake oriented to time place and person.  Moves all extremities. Psychiatric: Appears normal.  Normal affect.   Labs on Admission: I have personally reviewed following labs and imaging studies  CBC:  Recent Labs Lab 10/03/17 0115  WBC 6.5  HGB 12.3*  HCT 39.0  MCV 85.7  PLT 305   Basic Metabolic Panel:  Recent Labs Lab 10/03/17 0115  NA 141  K 3.2*  CL 106  CO2 25  GLUCOSE 137*  BUN 15  CREATININE 1.49*  CALCIUM 8.9  MG 1.7   GFR: Estimated Creatinine Clearance: 54.1 mL/min (A) (by C-G formula based on SCr of 1.49 mg/dL (H)). Liver Function Tests: No results for input(s): AST, ALT, ALKPHOS, BILITOT, PROT, ALBUMIN in the last 168 hours. No results for input(s): LIPASE, AMYLASE in the last 168 hours. No results for input(s): AMMONIA in the last 168 hours. Coagulation Profile:  Recent Labs Lab 10/03/17 0122  INR 1.20  Cardiac Enzymes:  Recent Labs Lab 10/03/17 0115  TROPONINI 0.16*   BNP (last 3 results) No results for input(s): PROBNP in the last 8760 hours. HbA1C: No results for input(s): HGBA1C in the last 72 hours. CBG: No results for input(s): GLUCAP in the last 168 hours. Lipid Profile: No results for input(s): CHOL, HDL, LDLCALC, TRIG, CHOLHDL, LDLDIRECT in the last 72 hours. Thyroid Function Tests: No results for input(s): TSH, T4TOTAL, FREET4, T3FREE, THYROIDAB in the last 72 hours. Anemia Panel: No results for input(s): VITAMINB12, FOLATE, FERRITIN, TIBC, IRON, RETICCTPCT in the last 72 hours. Urine analysis: No results  found for: COLORURINE, APPEARANCEUR, LABSPEC, PHURINE, GLUCOSEU, HGBUR, BILIRUBINUR, KETONESUR, PROTEINUR, UROBILINOGEN, NITRITE, LEUKOCYTESUR Sepsis Labs: @LABRCNTIP (procalcitonin:4,lacticidven:4) )No results found for this or any previous visit (from the past 240 hour(s)).   Radiological Exams on Admission: Dg Chest Port 1 View  Result Date: 10/03/2017 CLINICAL DATA:  76 y/o M; history fibrillation, shortness of breath, dry cough, chest congestion. EXAM: PORTABLE CHEST 1 VIEW COMPARISON:  None. FINDINGS: Enlarged cardiac silhouette. Transcutaneous pacing pads noted. Reticular markings and peripheral lines compatible with interstitial pulmonary edema. No consolidation, effusion, or pneumothorax identified. Bones are unremarkable. IMPRESSION: Enlarged cardiac silhouette.  Interstitial pulmonary edema. Electronically Signed   By: Mitzi Hansen M.D.   On: 10/03/2017 01:28    EKG: Independently reviewed.  A. fib with RVR.  Assessment/Plan Principal Problem:   Atrial fibrillation with RVR (HCC) Active Problems:   Acute CHF (congestive heart failure) (HCC)   Acute renal failure (ARF) (HCC)   Normocytic normochromic anemia   Tobacco abuse   Hypertensive urgency    1. A. fib with RVR new onset - patient has been started on heparin.  Chads 2 vasc score likely 2 due to given history of hypertension and possible CHF.  Patient has converted back to sinus rhythm.  He is off Cardizem infusion.  I have placed patient on Cardizem 60 mg p.o. twice daily.  Patient's home medication list is still not updated.  Further medications will be based on his home medication list.  Check cardiac markers 2D echo and TSH.  UDS. 2. Hypertensive urgency -improved with Cardizem.  I have ordered 1 dose of Lasix 20 mg IV and may need further doses.  Need to review patient's home medication doses.  Patient is on p.o. Cardizem at this time. 3. Possible acute CHF -unknown EF.  Patient states he does take a diuretic  but does not remember the dose.  1 dose of Lasix 20 mg IV has been ordered.  Check 2D echo.  Closely follow intake output and metabolic panel. 4. Renal failure acute versus chronic no labs to compare - Once we are able to access his old records from [facility] through care everywhere we can further categorize if this is acute or chronic.  UA is pending. 5. Mildly elevated troponin likely from CHF and hypertensive urgency -cycle cardiac markers patient is on heparin denies any chest pain.  Aspirin.  Check 2D echo. 6. Tobacco abuse -tobacco cessation counseling requested. 7. Normocytic normochromic anemia - follow CBC.  Further workup as outpatient if there is no further drop in hemoglobin.   DVT prophylaxis: Heparin. Code Status: Full code. Family Communication: Discussed with patient. Disposition Plan: Home. Consults called: Cardiology. Admission status: Observation.   Eduard Clos MD [facility] Pager [phone].  If 7PM-7AM, please contact night-coverage [email or web address] Password [facility]  10/03/2017, 6:44 AM

## 2017-10-03 NOTE — Progress Notes (Addendum)
Patient seen and examined, admitted earlier this morning by Dr. Toniann Fail -76 year old male with long-standing hypertension, poor medication compliance was admitted with progressive dyspnea on exertion, secondary to congestive heart failure/unknown EF, hypertensive urgency and new onset A. fib with RVR. -Also noted to have mildly elevated troponin with overall flat trend -We will continue diuresis today Lasix 40 mg every 12 with potassium -Follow-up 2D echocardiogram, check TSH -Continue heparin drip, transition to oral anticoagulant pending echocardiogram -Cardiology consult -Also noted hyperglycemia will check hemoglobin A1c  Zannie Cove, MD

## 2017-10-04 ENCOUNTER — Encounter (HOSPITAL_COMMUNITY): Payer: Self-pay | Admitting: Radiology

## 2017-10-04 ENCOUNTER — Observation Stay (HOSPITAL_COMMUNITY): Payer: Medicare Other

## 2017-10-04 DIAGNOSIS — N179 Acute kidney failure, unspecified: Secondary | ICD-10-CM | POA: Diagnosis not present

## 2017-10-04 DIAGNOSIS — Z8249 Family history of ischemic heart disease and other diseases of the circulatory system: Secondary | ICD-10-CM | POA: Diagnosis not present

## 2017-10-04 DIAGNOSIS — I340 Nonrheumatic mitral (valve) insufficiency: Secondary | ICD-10-CM | POA: Diagnosis present

## 2017-10-04 DIAGNOSIS — R748 Abnormal levels of other serum enzymes: Secondary | ICD-10-CM | POA: Diagnosis not present

## 2017-10-04 DIAGNOSIS — I110 Hypertensive heart disease with heart failure: Secondary | ICD-10-CM | POA: Diagnosis present

## 2017-10-04 DIAGNOSIS — I160 Hypertensive urgency: Secondary | ICD-10-CM | POA: Diagnosis not present

## 2017-10-04 DIAGNOSIS — Z720 Tobacco use: Secondary | ICD-10-CM | POA: Diagnosis not present

## 2017-10-04 DIAGNOSIS — I4891 Unspecified atrial fibrillation: Secondary | ICD-10-CM | POA: Diagnosis present

## 2017-10-04 DIAGNOSIS — I272 Pulmonary hypertension, unspecified: Secondary | ICD-10-CM

## 2017-10-04 DIAGNOSIS — I5043 Acute on chronic combined systolic (congestive) and diastolic (congestive) heart failure: Secondary | ICD-10-CM | POA: Diagnosis present

## 2017-10-04 DIAGNOSIS — D649 Anemia, unspecified: Secondary | ICD-10-CM | POA: Diagnosis not present

## 2017-10-04 DIAGNOSIS — F1410 Cocaine abuse, uncomplicated: Secondary | ICD-10-CM | POA: Diagnosis present

## 2017-10-04 DIAGNOSIS — R079 Chest pain, unspecified: Secondary | ICD-10-CM | POA: Diagnosis not present

## 2017-10-04 DIAGNOSIS — N289 Disorder of kidney and ureter, unspecified: Secondary | ICD-10-CM | POA: Diagnosis not present

## 2017-10-04 DIAGNOSIS — Z9114 Patient's other noncompliance with medication regimen: Secondary | ICD-10-CM | POA: Diagnosis not present

## 2017-10-04 DIAGNOSIS — Z820 Family history of epilepsy and other diseases of the nervous system: Secondary | ICD-10-CM | POA: Diagnosis not present

## 2017-10-04 DIAGNOSIS — R011 Cardiac murmur, unspecified: Secondary | ICD-10-CM | POA: Diagnosis present

## 2017-10-04 DIAGNOSIS — I481 Persistent atrial fibrillation: Secondary | ICD-10-CM | POA: Diagnosis present

## 2017-10-04 DIAGNOSIS — F17210 Nicotine dependence, cigarettes, uncomplicated: Secondary | ICD-10-CM | POA: Diagnosis present

## 2017-10-04 DIAGNOSIS — R0602 Shortness of breath: Secondary | ICD-10-CM | POA: Diagnosis not present

## 2017-10-04 DIAGNOSIS — R7303 Prediabetes: Secondary | ICD-10-CM | POA: Diagnosis present

## 2017-10-04 LAB — CBC
HCT: 36.4 % — ABNORMAL LOW (ref 39.0–52.0)
Hemoglobin: 11.8 g/dL — ABNORMAL LOW (ref 13.0–17.0)
MCH: 27.5 pg (ref 26.0–34.0)
MCHC: 32.4 g/dL (ref 30.0–36.0)
MCV: 84.8 fL (ref 78.0–100.0)
PLATELETS: 232 10*3/uL (ref 150–400)
RBC: 4.29 MIL/uL (ref 4.22–5.81)
RDW: 14.7 % (ref 11.5–15.5)
WBC: 4.8 10*3/uL (ref 4.0–10.5)

## 2017-10-04 LAB — HEPARIN LEVEL (UNFRACTIONATED)
HEPARIN UNFRACTIONATED: 0.23 [IU]/mL — AB (ref 0.30–0.70)
Heparin Unfractionated: 0.64 IU/mL (ref 0.30–0.70)

## 2017-10-04 LAB — BASIC METABOLIC PANEL
Anion gap: 10 (ref 5–15)
BUN: 13 mg/dL (ref 6–20)
CALCIUM: 8.7 mg/dL — AB (ref 8.9–10.3)
CO2: 25 mmol/L (ref 22–32)
Chloride: 103 mmol/L (ref 101–111)
Creatinine, Ser: 1.25 mg/dL — ABNORMAL HIGH (ref 0.61–1.24)
GFR calc Af Amer: >60 mL/min (ref >60)
GFR, EST NON AFRICAN AMERICAN: 55 mL/min — AB (ref >60)
GLUCOSE: 92 mg/dL (ref 65–99)
Potassium: 3.8 mmol/L (ref 3.5–5.1)
Sodium: 138 mmol/L (ref 135–145)

## 2017-10-04 MED ORDER — FUROSEMIDE 10 MG/ML IJ SOLN
40.0000 mg | Freq: Every day | INTRAMUSCULAR | Status: DC
  Administered 2017-10-05: 40 mg via INTRAVENOUS
  Filled 2017-10-04: qty 4

## 2017-10-04 MED ORDER — HYDRALAZINE HCL 25 MG PO TABS
25.0000 mg | ORAL_TABLET | Freq: Three times a day (TID) | ORAL | Status: DC
  Administered 2017-10-04 – 2017-10-08 (×12): 25 mg via ORAL
  Filled 2017-10-04 (×12): qty 1

## 2017-10-04 MED ORDER — IOPAMIDOL (ISOVUE-370) INJECTION 76%
INTRAVENOUS | Status: AC
  Administered 2017-10-04: 80 mL
  Filled 2017-10-04: qty 100

## 2017-10-04 MED ORDER — POTASSIUM CHLORIDE 20 MEQ PO PACK
40.0000 meq | PACK | Freq: Once | ORAL | Status: AC
  Administered 2017-10-04: 40 meq via ORAL
  Filled 2017-10-04: qty 2

## 2017-10-04 NOTE — Progress Notes (Signed)
Progress Note  Patient Name: Adam Willis Date of Encounter: 10/04/2017  Primary Cardiologist: Dr. Armanda Magic  Subjective   No chest pain or shortness of breath at rest. No palpitations. No abdominal pain or emesis.  Inpatient Medications    Scheduled Meds: . diltiazem  60 mg Oral Q12H  . furosemide  40 mg Intravenous Q12H  . potassium chloride  40 mEq Oral Once   Continuous Infusions: . heparin 1,700 Units/hr (10/04/17 0623)   PRN Meds: acetaminophen **OR** acetaminophen, hydrALAZINE, ondansetron **OR** ondansetron (ZOFRAN) IV   Vital Signs    Vitals:   10/04/17 0520 10/04/17 0521 10/04/17 0759 10/04/17 0930  BP: (!) 169/91 (!) 169/91 (!) 160/82 (!) 135/116  Pulse:   93 93  Resp:  14 15   Temp:   97.9 F (36.6 C)   TempSrc:   Oral   SpO2:   95%   Weight:      Height:        Intake/Output Summary (Last 24 hours) at 10/04/17 1001 Last data filed at 10/04/17 0900  Gross per 24 hour  Intake           537.93 ml  Output              200 ml  Net           337.93 ml   Filed Weights   10/03/17 0111 10/03/17 1619  Weight: 220 lb (99.8 kg) 225 lb 4.8 oz (102.2 kg)    Telemetry    Rate-controlled atrial fibrillation. Personally reviewed.  ECG    Tracing from 10/03/2017 shows rate-controlled atrial fibrillation with ST-T wave changes, consider anterior ischemia. Personally reviewed.  Physical Exam   GEN: Overweight male. No acute distress.   Neck: No JVD. Cardiac: Irregularly irregular, increased second heart sound, no gallop.  Respiratory: Nonlabored. Clear to auscultation bilaterally. GI: Soft, nontender, bowel sounds present. MS: No edema; No deformity. Neuro:  Nonfocal. Psych: Alert and oriented x 3. Normal affect.  Labs    Chemistry Recent Labs Lab 10/03/17 0115 10/04/17 0344  NA 141 138  K 3.2* 3.8  CL 106 103  CO2 25 25  GLUCOSE 137* 92  BUN 15 13  CREATININE 1.49* 1.25*  CALCIUM 8.9 8.7*  GFRNONAA 44* 55*  GFRAA 52* >60    ANIONGAP 10 10     Hematology Recent Labs Lab 10/03/17 0115 10/04/17 0344  WBC 6.5 4.8  RBC 4.55 4.29  HGB 12.3* 11.8*  HCT 39.0 36.4*  MCV 85.7 84.8  MCH 27.0 27.5  MCHC 31.5 32.4  RDW 14.5 14.7  PLT 305 232    Cardiac Enzymes Recent Labs Lab 10/03/17 0115 10/03/17 0536 10/03/17 1124 10/03/17 1756  TROPONINI 0.16* 0.17* 0.20* 0.19*   No results for input(s): TROPIPOC in the last 168 hours.   BNP Recent Labs Lab 10/03/17 0115  BNP 614.9*     Radiology    Dg Chest Port 1 View  Result Date: 10/03/2017 CLINICAL DATA:  76 y/o M; history fibrillation, shortness of breath, dry cough, chest congestion. EXAM: PORTABLE CHEST 1 VIEW COMPARISON:  None. FINDINGS: Enlarged cardiac silhouette. Transcutaneous pacing pads noted. Reticular markings and peripheral lines compatible with interstitial pulmonary edema. No consolidation, effusion, or pneumothorax identified. Bones are unremarkable. IMPRESSION: Enlarged cardiac silhouette.  Interstitial pulmonary edema. Electronically Signed   By: Mitzi Hansen M.D.   On: 10/03/2017 01:28    Cardiac Studies   Echocardiogram 10/03/2017: Study Conclusions  - Left ventricle: The cavity  size was normal. Wall thickness was   increased in a pattern of moderate LVH. Systolic function was   normal. The estimated ejection fraction was in the range of 50%   to 55%. Wall motion was normal; there were no regional wall   motion abnormalities. - Mitral valve: There was mild regurgitation. - Left atrium: The atrium was moderately dilated. - Right ventricle: The cavity size was mildly dilated. Systolic   function was mildly reduced. - Right atrium: The atrium was mildly dilated. - Pulmonary arteries: Systolic pressure was moderately to severely   increased. PA peak pressure: 67 mm Hg (S). - Pericardium, extracardiac: A trivial pericardial effusion was   identified.  Impressions:  - Normal LV systolic function; moderate LVH;  mild MR; biatrial   enlargement; mild RVE with mildly reduced function; mild to   moderate TR with moderate to severe pulmonary hypertension.  Patient Profile     76 y.o. male with a history of hypertension, tobacco use, and heart murmur, presenting with progressive dyspnea on exertion, hypertensive urgency, and newly documented atrial fibrillation with mildly abnormal troponin I levels.  Assessment & Plan    1. Persistent atrial fibrillation. CHADSVASC score is 3. He continues on IV heparin and oral Cardizem. Heart rate controlled. LVEF 50-55% with moderately dilated left atrium and mild mitral regurgitation.  2. Severe pulmonary hypertension by echocardiogram with right ventricular dysfunction. PASP estimated 67 mmHg.  3. Mild troponin I elevation, peak 0.20, overall relatively flat pattern not entirely consistent with ACS.  4. Essential hypertension, uncontrolled blood pressure although trend better than at presentation.  5. UDS positive for cocaine. Patient did not report history of substance use. Could certainly also be contributing to his presentation.  Continue Cardizem and heparin, reduce IV Lasix 40 mg from twice a day to daily. Will obtain chest CTA to exclude thromboembolic disease in light of severe pulmonary hypertension with RV dysfunction and also recent atrial fibrillation. Depending on these results, will discuss potential cardiac catheterization. Add hydralazine.  Signed, Nona DellSamuel Asaf Elmquist, MD  10/04/2017, 10:01 AM

## 2017-10-04 NOTE — Progress Notes (Signed)
PROGRESS NOTE  Adam CedarKenneth Willis ZOX:[id number] DOB: Apr 16, 1941 DOA: 10/03/2017 [facility]CP: [facility]  HPI/Recap of past 3324 hours: 76 year old male with a history of hypertension, poor medication compliance, tobacco abuse, cocaine abuse, presents to the ED with complaints of shortness of breath and palpitations.  Patient was found to be in hypertensive urgency and a newly documented atrial fibrillation with RVR and mildly abnormal troponin levels.  Urine drug screen also positive for cocaine.  Patient was started on IV Cardizem with improvement in heart rate and now transitioned to p.o. Cardizem  Today, patient reports feeling well denies any chest pain, worsening shortness of breath, palpitations, abdominal pain, nausea, vomiting, diarrhea, dizziness.  Patient counseled on the need to avoid illicit drugs.    Assessment/Plan: Principal Problem:   Atrial fibrillation with RVR (HCC) Active Problems:   Acute CHF (congestive heart failure) (HCC)   Acute renal failure (ARF) (HCC)   Normocytic normochromic anemia   Tobacco abuse   Hypertensive urgency   New onset a-fib (HCC)  #New onset A. Fib with  RVR Rate controlled Chads vas score is 3 TSH within normal limits E KG shows A. fib Echo showed LVEF 50-55% with moderately dilated left atrium and mild mitrial regurgitation, severe pulmonary hypertension with right ventricular dysfunction. PASP estimated at 67mmHg Status post IV Cardizem, switch to oral Cardizem Continue anticoagulation with IV heparin Monitor electrolytes Cardiology consult  #Mild troponin elevation Overall relatively flat pattern, unlikely ACS Possibly demand ischemia EKG-EKG shows A. fib A. fib   #Hypertensive urgency Improving Urgency resulting in ??flash pulm edema CXR with congestion Continue, cardizem, hydralazine, plus daily Lasix  #AKI Cr 1.4->1.2 Improving, unsure if patient has CKD Daily BMP  #Pre-diabetic A1c, 6 PCP to follow up on  A1c Lifestyle modification  #Substance abuse & tobacco abuse Urine drug screen positive for cocaine Patient advised to stay off illicit drugs and quit smoking, patient verbalized understanding  #Normocytic anemia Unclear etiology Daily CBC, will monitor Further workup as outpatient if there is no further drop in hemoglobin      Code Status: Full  Family Communication: Discussed with patient and significant other  Disposition Plan: Home once stable   Consultants: Cardiology  Procedures: None  Antimicrobials:  None  DVT prophylaxis:  IV heparin   Objective: Vitals:   10/04/17 0930 10/04/17 1329 10/04/17 1527 10/04/17 2015  BP: (!) 135/116 (!) 144/93 (!) 156/109 (!) 171/94  Pulse: 93 66  89  Resp:   18 (!) 23  Temp:  97.9 F (36.6 C)  97.9 F (36.6 C)  TempSrc:  Oral  Oral  SpO2:  98%    Weight:      Height:        Intake/Output Summary (Last 24 hours) at 10/04/17 2033 Last data filed at 10/04/17 1300  Gross per 24 hour  Intake            708.8 ml  Output              601 ml  Net            107.8 ml   Filed Weights   10/03/17 0111 10/03/17 1619  Weight: 99.8 kg (220 lb) 102.2 kg (225 lb 4.8 oz)    Exam:   General: Awake, alert, oriented x3  Cardiovascular: Irregular irregular heart sounds, no gallop  Respiratory: Clear to auscultation bilaterally  Abdomen: Soft, nontender, nondistended, bowel sounds present  Musculoskeletal: No pedal edema  Skin: Normal  Psychiatry: Normal mood  Data Reviewed: CBC:  Recent Labs Lab 10/03/17 0115 10/04/17 0344  WBC 6.5 4.8  HGB 12.3* 11.8*  HCT 39.0 36.4*  MCV 85.7 84.8  PLT 305 232   Basic Metabolic Panel:  Recent Labs Lab 10/03/17 0115 10/04/17 0344  NA 141 138  K 3.2* 3.8  CL 106 103  CO2 25 25  GLUCOSE 137* 92  BUN 15 13  CREATININE 1.49* 1.25*  CALCIUM 8.9 8.7*  MG 1.7  --    GFR: Estimated Creatinine Clearance: 67.2 mL/min (A) (by C-G formula based on SCr of 1.25 mg/dL  (H)). Liver Function Tests: No results for input(s): AST, ALT, ALKPHOS, BILITOT, PROT, ALBUMIN in the last 168 hours. No results for input(s): LIPASE, AMYLASE in the last 168 hours. No results for input(s): AMMONIA in the last 168 hours. Coagulation Profile:  Recent Labs Lab 10/03/17 0122  INR 1.20   Cardiac Enzymes:  Recent Labs Lab 10/03/17 0115 10/03/17 0536 10/03/17 1124 10/03/17 1756  TROPONINI 0.16* 0.17* 0.20* 0.19*   BNP (last 3 results) No results for input(s): PROBNP in the last 8760 hours. HbA1C:  Recent Labs  10/03/17 0115  HGBA1C 6.0*   CBG: No results for input(s): GLUCAP in the last 168 hours. Lipid Profile: No results for input(s): CHOL, HDL, LDLCALC, TRIG, CHOLHDL, LDLDIRECT in the last 72 hours. Thyroid Function Tests:  Recent Labs  10/03/17 0536  TSH 1.275   Anemia Panel: No results for input(s): VITAMINB12, FOLATE, FERRITIN, TIBC, IRON, RETICCTPCT in the last 72 hours. Urine analysis:    Component Value Date/Time   COLORURINE COLORLESS (A) 10/03/2017 2153   APPEARANCEUR CLEAR 10/03/2017 2153   LABSPEC 1.005 10/03/2017 2153   PHURINE 6.0 10/03/2017 2153   GLUCOSEU NEGATIVE 10/03/2017 2153   HGBUR NEGATIVE 10/03/2017 2153   BILIRUBINUR NEGATIVE 10/03/2017 2153   KETONESUR NEGATIVE 10/03/2017 2153   PROTEINUR NEGATIVE 10/03/2017 2153   NITRITE NEGATIVE 10/03/2017 2153   LEUKOCYTESUR NEGATIVE 10/03/2017 2153   Sepsis Labs: @LABRCNTIP (procalcitonin:4,lacticidven:4)  )No results found for this or any previous visit (from the past 240 hour(s)).    Studies: Ct Angio Chest Pe W Or Wo Contrast  Result Date: 10/04/2017 CLINICAL DATA:  Shortness of breath for the past 2 days.  No pain. EXAM: CT ANGIOGRAPHY CHEST WITH CONTRAST TECHNIQUE: Multidetector CT imaging of the chest was performed using the standard protocol during bolus administration of intravenous contrast. Multiplanar CT image reconstructions and MIPs were obtained to evaluate  the vascular anatomy. CONTRAST:  80 mL Isovue 370 COMPARISON:  None. FINDINGS: Cardiovascular: Satisfactory opacification of the pulmonary arteries to the segmental level. No evidence of pulmonary embolism. Stable cardiomegaly. No pericardial effusion. Normal caliber thoracic aorta. No thoracic aortic aneurysm. Mediastinum/Nodes: No enlarged mediastinal, hilar, or axillary lymph nodes. Thyroid gland, trachea, and esophagus demonstrate no significant findings. Lungs/Pleura: Small right pleural effusion. Bibasilar dependent atelectasis. Bilateral centrilobular emphysema in the upper lobes. No pneumothorax. Upper Abdomen: No acute abnormality. Musculoskeletal: No chest wall abnormality. No acute or significant osseous findings. Review of the MIP images confirms the above findings. IMPRESSION: 1. No evidence of pulmonary embolus. 2. Small right pleural effusion.  Stable cardiomegaly. Electronically Signed   By: Elige Ko   On: 10/04/2017 13:28    Scheduled Meds: . diltiazem  60 mg Oral Q12H  . [START ON 10/05/2017] furosemide  40 mg Intravenous Daily  . hydrALAZINE  25 mg Oral Q8H    Continuous Infusions: . heparin 1,700 Units/hr (10/04/17 0623)     LOS: 0  days     Adam Cedar, MD [facility] Pager [phone]  If 7PM-7AM, please contact night-coverage [email or web address] Password [facility] 10/04/2017, 8:33 PM

## 2017-10-04 NOTE — Progress Notes (Signed)
ANTICOAGULATION CONSULT NOTE - Follow Up Consult  Pharmacy Consult for heparin Indication: atrial fibrillation  Labs:  Recent Labs  10/03/17 0115 10/03/17 0122 10/03/17 0536 10/03/17 1124 10/03/17 1756 10/03/17 2119 10/04/17 0344  HGB 12.3*  --   --   --   --   --  11.8*  HCT 39.0  --   --   --   --   --  36.4*  PLT 305  --   --   --   --   --  232  APTT  --  28  --   --   --   --   --   LABPROT  --  15.1  --   --   --   --   --   INR  --  1.20  --   --   --   --   --   HEPARINUNFRC  --   --   --  0.13*  --  0.15* 0.23*  CREATININE 1.49*  --   --   --   --   --   --   TROPONINI 0.16*  --  0.17* 0.20* 0.19*  --   --     Assessment: 76yo male remains subtherapeutic on heparin after rate changes though now closer to goal.  Goal of Therapy:  Heparin level 0.3-0.7 units/ml   Plan:  Will increase heparin gtt by 2 units/kg/hr to 1700 units/hr and check level in 8hr.  Vernard Gambles, PharmD, BCPS  10/04/2017,6:22 AM

## 2017-10-04 NOTE — Progress Notes (Signed)
ANTICOAGULATION CONSULT NOTE - Follow Up Consult  Pharmacy Consult for heparin Indication: atrial fibrillation  Labs:  Recent Labs  10/03/17 0115 10/03/17 0122 10/03/17 0536  10/03/17 1124 10/03/17 1756 10/03/17 2119 10/04/17 0344 10/04/17 1502  HGB 12.3*  --   --   --   --   --   --  11.8*  --   HCT 39.0  --   --   --   --   --   --  36.4*  --   PLT 305  --   --   --   --   --   --  232  --   APTT  --  28  --   --   --   --   --   --   --   LABPROT  --  15.1  --   --   --   --   --   --   --   INR  --  1.20  --   --   --   --   --   --   --   HEPARINUNFRC  --   --   --   < > 0.13*  --  0.15* 0.23* 0.64  CREATININE 1.49*  --   --   --   --   --   --  1.25*  --   TROPONINI 0.16*  --  0.17*  --  0.20* 0.19*  --   --   --   < > = values in this interval not displayed.  Assessment: 76yo male now therapeutic on heparin  Goal of Therapy:  Heparin level 0.3-0.7 units/ml   Plan:  Continue heparin at 1700 units / hr Follow up AM labs  Thank you  Okey Regal, PharmD [phone] 10/04/2017,4:17 PM

## 2017-10-05 ENCOUNTER — Inpatient Hospital Stay (HOSPITAL_COMMUNITY): Payer: Medicare Other

## 2017-10-05 DIAGNOSIS — R748 Abnormal levels of other serum enzymes: Secondary | ICD-10-CM

## 2017-10-05 LAB — CBC
HCT: 39.8 % (ref 39.0–52.0)
HEMOGLOBIN: 13 g/dL (ref 13.0–17.0)
MCH: 27.8 pg (ref 26.0–34.0)
MCHC: 32.7 g/dL (ref 30.0–36.0)
MCV: 85 fL (ref 78.0–100.0)
Platelets: 273 10*3/uL (ref 150–400)
RBC: 4.68 MIL/uL (ref 4.22–5.81)
RDW: 14.7 % (ref 11.5–15.5)
WBC: 5.3 10*3/uL (ref 4.0–10.5)

## 2017-10-05 LAB — BASIC METABOLIC PANEL
Anion gap: 9 (ref 5–15)
BUN: 16 mg/dL (ref 6–20)
CHLORIDE: 101 mmol/L (ref 101–111)
CO2: 26 mmol/L (ref 22–32)
Calcium: 8.9 mg/dL (ref 8.9–10.3)
Creatinine, Ser: 1.45 mg/dL — ABNORMAL HIGH (ref 0.61–1.24)
GFR calc non Af Amer: 46 mL/min — ABNORMAL LOW (ref >60)
GFR, EST AFRICAN AMERICAN: 53 mL/min — AB (ref >60)
Glucose, Bld: 123 mg/dL — ABNORMAL HIGH (ref 65–99)
POTASSIUM: 3.5 mmol/L (ref 3.5–5.1)
Sodium: 136 mmol/L (ref 135–145)

## 2017-10-05 LAB — HEPARIN LEVEL (UNFRACTIONATED): Heparin Unfractionated: 0.69 IU/mL (ref 0.30–0.70)

## 2017-10-05 MED ORDER — SODIUM CHLORIDE 0.9 % WEIGHT BASED INFUSION
3.0000 mL/kg/h | INTRAVENOUS | Status: AC
  Administered 2017-10-06: 3 mL/kg/h via INTRAVENOUS

## 2017-10-05 MED ORDER — FUROSEMIDE 10 MG/ML IJ SOLN: 40.0000 mg | Freq: Every day | INTRAMUSCULAR | Status: DC

## 2017-10-05 MED ORDER — SODIUM CHLORIDE 0.9% FLUSH: 3.0000 mL | INTRAVENOUS | Status: DC | PRN

## 2017-10-05 MED ORDER — ASPIRIN 81 MG PO CHEW
81.0000 mg | CHEWABLE_TABLET | ORAL | Status: AC
  Administered 2017-10-06: 81 mg via ORAL
  Filled 2017-10-05: qty 1

## 2017-10-05 MED ORDER — SODIUM CHLORIDE 0.9 % IV SOLN: 250.0000 mL | INTRAVENOUS | Status: DC | PRN

## 2017-10-05 MED ORDER — POTASSIUM CHLORIDE 20 MEQ PO PACK
40.0000 meq | PACK | Freq: Every day | ORAL | Status: DC
  Administered 2017-10-05 – 2017-10-06 (×2): 40 meq via ORAL
  Filled 2017-10-05 (×3): qty 2

## 2017-10-05 MED ORDER — ASPIRIN EC 81 MG PO TBEC
81.0000 mg | DELAYED_RELEASE_TABLET | Freq: Every day | ORAL | Status: DC
  Administered 2017-10-05 – 2017-10-06 (×2): 81 mg via ORAL
  Filled 2017-10-05 (×2): qty 1

## 2017-10-05 MED ORDER — SODIUM CHLORIDE 0.9 % WEIGHT BASED INFUSION: 1.0000 mL/kg/h | INTRAVENOUS | Status: DC

## 2017-10-05 MED ORDER — SODIUM CHLORIDE 0.9% FLUSH: 3.0000 mL | Freq: Two times a day (BID) | INTRAVENOUS | Status: DC

## 2017-10-05 NOTE — Progress Notes (Signed)
PROGRESS NOTE  Adam Willis VZS:[id number] DOB: August 19, 1941 DOA: 10/03/2017 PCP: [facility]  HPI/Recap of past 43 hours: 76 year old male with a history of hypertension, medication compliance, tobacco abuse, cocaine abuse presents to the ED with complaints of shortness of breath and palpitations.  Patient was found to be in hypertensive urgency and initially documented A. fib with RVR, and mildly abnormal troponin levels.  Urine drug screen also positive for cocaine.  Patient was started on IV Cardizem with improvement in heart rate and not transition to p.o. Cardizem.  Today, patient reports feeling well, denies any chest pain, worsening shortness of breath, palpitations, abdominal pain, nausea, vomiting, diarrhea, dizziness.  Assessment/Plan: Principal Problem:   Atrial fibrillation with RVR (HCC) Active Problems:   Acute CHF (congestive heart failure) (HCC)   Acute renal failure (ARF) (HCC)   Normocytic normochromic anemia   Tobacco abuse   Hypertensive urgency   New onset a-fib (HCC)  #New onset A. fib with RVR Rate controlled Chads vas score is 3 TSH within normal limits EKG shows A. fib Echo showed LVEF 50-55% with moderately dilated left atrium, severe pulmonary hypertension with right ventricular dysfunction.  PASP estimated at 67 mmHg Status post IV Cardizem, continue oral Cardizem Continue anticoagulation with IV heparin  Monitor electrolytes Cardiology on board  #Pulmonary hypertension Echo as above, with PASP estimated at 67 mmHg Unclear etiology CT angio chest: Showed no evidence of any thromboembolic disease Cardiology on board, recommend left and right heart catheterization on 10/06/17  #Mild troponin elevation Overall relatively flat pattern, unlikely ACS Possible demand ischemia  #Hypertensive urgency Controlled Chest x-ray with congestion, ??  Flash pulmonary edema Continue hydralazine, Cardizem, Lasix  #AK I Stable Daily  BMP  #Prediabetic A1c 6 PCP to follow-up on A1c  lifestyle modification  #Substance abuse and tobacco abuse Urine drug screen positive for cocaine Patient advised to quit  #Normocytic anemia Unclear etiology Daily CBC, will monitor Further workup as outpatient if there is no further drop in hemoglobin   Code Status: Full  Family Communication: Discussed with patient and significant other  Disposition Plan: Home once stable   Consultants:  Cardiology  Procedures:  None  Antimicrobials:  None  DVT prophylaxis: IV heparin   Objective: Vitals:   10/05/17 0318 10/05/17 0532 10/05/17 0943 10/05/17 1350  BP: (!) 151/101 113/80 (!) 137/99 (!) 135/98  Pulse: 81   64  Resp: (!) 21   18  Temp: 98.2 F (36.8 C)   97.8 F (36.6 C)  TempSrc: Oral   Oral  SpO2: 97%     Weight: 97.2 kg (214 lb 3.2 oz)     Height:        Intake/Output Summary (Last 24 hours) at 10/05/17 1411 Last data filed at 10/05/17 0300  Gross per 24 hour  Intake           408.81 ml  Output                0 ml  Net           408.81 ml   Filed Weights   10/03/17 0111 10/03/17 1619 10/05/17 0318  Weight: 99.8 kg (220 lb) 102.2 kg (225 lb 4.8 oz) 97.2 kg (214 lb 3.2 oz)    Exam:   General: Awake, alert, oriented x3  Cardiovascular: Irregularly irregular heart sounds  Respiratory: Clear to auscultation bilaterally  Abdomen: Soft, nontender, nondistended, bowel sounds present  Musculoskeletal: No pedal edema  Skin: Normal  Psychiatry: Normal  mood   Data Reviewed: CBC:  Recent Labs Lab 10/03/17 0115 10/04/17 0344 10/05/17 0525  WBC 6.5 4.8 5.3  HGB 12.3* 11.8* 13.0  HCT 39.0 36.4* 39.8  MCV 85.7 84.8 85.0  PLT 305 232 273   Basic Metabolic Panel:  Recent Labs Lab 10/03/17 0115 10/04/17 0344 10/05/17 0525  NA 141 138 136  K 3.2* 3.8 3.5  CL 106 103 101  CO2 25 25 26   GLUCOSE 137* 92 123*  BUN 15 13 16   CREATININE 1.49* 1.25* 1.45*  CALCIUM 8.9 8.7* 8.9  MG  1.7  --   --    GFR: Estimated Creatinine Clearance: 53.4 mL/min (A) (by C-G formula based on SCr of 1.45 mg/dL (H)). Liver Function Tests: No results for input(s): AST, ALT, ALKPHOS, BILITOT, PROT, ALBUMIN in the last 168 hours. No results for input(s): LIPASE, AMYLASE in the last 168 hours. No results for input(s): AMMONIA in the last 168 hours. Coagulation Profile:  Recent Labs Lab 10/03/17 0122  INR 1.20   Cardiac Enzymes:  Recent Labs Lab 10/03/17 0115 10/03/17 0536 10/03/17 1124 10/03/17 1756  TROPONINI 0.16* 0.17* 0.20* 0.19*   BNP (last 3 results) No results for input(s): PROBNP in the last 8760 hours. HbA1C:  Recent Labs  10/03/17 0115  HGBA1C 6.0*   CBG: No results for input(s): GLUCAP in the last 168 hours. Lipid Profile: No results for input(s): CHOL, HDL, LDLCALC, TRIG, CHOLHDL, LDLDIRECT in the last 72 hours. Thyroid Function Tests:  Recent Labs  10/03/17 0536  TSH 1.275   Anemia Panel: No results for input(s): VITAMINB12, FOLATE, FERRITIN, TIBC, IRON, RETICCTPCT in the last 72 hours. Urine analysis:    Component Value Date/Time   COLORURINE COLORLESS (A) 10/03/2017 2153   APPEARANCEUR CLEAR 10/03/2017 2153   LABSPEC 1.005 10/03/2017 2153   PHURINE 6.0 10/03/2017 2153   GLUCOSEU NEGATIVE 10/03/2017 2153   HGBUR NEGATIVE 10/03/2017 2153   BILIRUBINUR NEGATIVE 10/03/2017 2153   KETONESUR NEGATIVE 10/03/2017 2153   PROTEINUR NEGATIVE 10/03/2017 2153   NITRITE NEGATIVE 10/03/2017 2153   LEUKOCYTESUR NEGATIVE 10/03/2017 2153   Sepsis Labs: @LABRCNTIP (procalcitonin:4,lacticidven:4)  )No results found for this or any previous visit (from the past 240 hour(s)).    Studies: Dg Chest Port 1v Same Day  Result Date: 10/05/2017 CLINICAL DATA:  Atrial fibrillation. EXAM: PORTABLE CHEST 1 VIEW COMPARISON:  10/03/2017. FINDINGS: The heart remains enlarged. Interstitial prominence represent pulmonary edema is improved compared with priors. No  consolidation, effusion, or pneumothorax. Thoracic atherosclerosis. IMPRESSION: Interstitial prominence representing pulmonary edema is improved compared with priors. No change cardiomegaly. Electronically Signed   By: Elsie StainJohn T Curnes M.D.   On: 10/05/2017 09:59    Scheduled Meds: . aspirin EC  81 mg Oral Daily  . diltiazem  60 mg Oral Q12H  . furosemide  40 mg Intravenous Daily  . hydrALAZINE  25 mg Oral Q8H  . potassium chloride  40 mEq Oral Daily    Continuous Infusions: . heparin 1,700 Units/hr (10/05/17 0615)     LOS: 1 day     Briant CedarNkeiruka J Cari Vandeberg, MD [facility] Pager [phone]  If 7PM-7AM, please contact night-coverage [email or web address] Password [email or web address] 10/05/2017, 2:11 PM

## 2017-10-05 NOTE — Progress Notes (Signed)
 Progress Note  Patient Name: Adam Willis Date of Encounter: 10/05/2017  Primary Cardiologist: Dr. Traci Turner  Subjective   No chest pain or breathlessness at rest. Mild sense of palpitations. No abdominal pain or emesis  Inpatient Medications    Scheduled Meds: . diltiazem  60 mg Oral Q12H  . furosemide  40 mg Intravenous Daily  . hydrALAZINE  25 mg Oral Q8H  . potassium chloride  40 mEq Oral Daily   Continuous Infusions: . heparin 1,700 Units/hr (10/05/17 0615)   PRN Meds: acetaminophen **OR** acetaminophen, hydrALAZINE, ondansetron **OR** ondansetron (ZOFRAN) IV   Vital Signs    Vitals:   10/04/17 2210 10/04/17 2248 10/05/17 0318 10/05/17 0532  BP: (!) 170/116 (!) 149/105 (!) 151/101 113/80  Pulse:   81   Resp:  (!) 23 (!) 21   Temp:   98.2 F (36.8 C)   TempSrc:   Oral   SpO2: 100%  97%   Weight:   214 lb 3.2 oz (97.2 kg)   Height:        Intake/Output Summary (Last 24 hours) at 10/05/17 0939 Last data filed at 10/05/17 0300  Gross per 24 hour  Intake           768.81 ml  Output              401 ml  Net           367.81 ml   Filed Weights   10/03/17 0111 10/03/17 1619 10/05/17 0318  Weight: 220 lb (99.8 kg) 225 lb 4.8 oz (102.2 kg) 214 lb 3.2 oz (97.2 kg)    Telemetry    Atrial fibrillation. Personally reviewed.  Physical Exam   GEN: Overweight male. No acute distress.   Neck: No JVD. Cardiac: Irregularly irregular, increased second heart sound, no gallop.  Respiratory: Nonlabored. Clear to auscultation bilaterally. GI: Soft, nontender, bowel sounds present. MS: No edema; No deformity. Neuro:  Nonfocal. Psych: Alert and oriented x 3. Normal affect.  Labs    Chemistry  Recent Labs Lab 10/03/17 0115 10/04/17 0344 10/05/17 0525  NA 141 138 136  K 3.2* 3.8 3.5  CL 106 103 101  CO2 25 25 26  GLUCOSE 137* 92 123*  BUN 15 13 16  CREATININE 1.49* 1.25* 1.45*  CALCIUM 8.9 8.7* 8.9  GFRNONAA 44* 55* 46*  GFRAA 52* >60 53*    ANIONGAP 10 10 9     Hematology  Recent Labs Lab 10/03/17 0115 10/04/17 0344 10/05/17 0525  WBC 6.5 4.8 5.3  RBC 4.55 4.29 4.68  HGB 12.3* 11.8* 13.0  HCT 39.0 36.4* 39.8  MCV 85.7 84.8 85.0  MCH 27.0 27.5 27.8  MCHC 31.5 32.4 32.7  RDW 14.5 14.7 14.7  PLT 305 232 273    Cardiac Enzymes  Recent Labs Lab 10/03/17 0115 10/03/17 0536 10/03/17 1124 10/03/17 1756  TROPONINI 0.16* 0.17* 0.20* 0.19*   No results for input(s): TROPIPOC in the last 168 hours.   BNP  Recent Labs Lab 10/03/17 0115  BNP 614.9*     Radiology    Ct Angio Chest Pe W Or Wo Contrast  Result Date: 10/04/2017 CLINICAL DATA:  Shortness of breath for the past 2 days.  No pain. EXAM: CT ANGIOGRAPHY CHEST WITH CONTRAST TECHNIQUE: Multidetector CT imaging of the chest was performed using the standard protocol during bolus administration of intravenous contrast. Multiplanar CT image reconstructions and MIPs were obtained to evaluate the vascular anatomy. CONTRAST:  80 mL Isovue 370   COMPARISON:  None. FINDINGS: Cardiovascular: Satisfactory opacification of the pulmonary arteries to the segmental level. No evidence of pulmonary embolism. Stable cardiomegaly. No pericardial effusion. Normal caliber thoracic aorta. No thoracic aortic aneurysm. Mediastinum/Nodes: No enlarged mediastinal, hilar, or axillary lymph nodes. Thyroid gland, trachea, and esophagus demonstrate no significant findings. Lungs/Pleura: Small right pleural effusion. Bibasilar dependent atelectasis. Bilateral centrilobular emphysema in the upper lobes. No pneumothorax. Upper Abdomen: No acute abnormality. Musculoskeletal: No chest wall abnormality. No acute or significant osseous findings. Review of the MIP images confirms the above findings. IMPRESSION: 1. No evidence of pulmonary embolus. 2. Small right pleural effusion.  Stable cardiomegaly. Electronically Signed   By: Elige Ko   On: 10/04/2017 13:28    Cardiac Studies   Echocardiogram  10/03/2017: Study Conclusions  - Left ventricle: The cavity size was normal. Wall thickness was increased in a pattern of moderate LVH. Systolic function was normal. The estimated ejection fraction was in the range of 50% to 55%. Wall motion was normal; there were no regional wall motion abnormalities. - Mitral valve: There was mild regurgitation. - Left atrium: The atrium was moderately dilated. - Right ventricle: The cavity size was mildly dilated. Systolic function was mildly reduced. - Right atrium: The atrium was mildly dilated. - Pulmonary arteries: Systolic pressure was moderately to severely increased. PA peak pressure: 67 mm Hg (S). - Pericardium, extracardiac: A trivial pericardial effusion was identified.  Impressions:  - Normal LV systolic function; moderate LVH; mild MR; biatrial enlargement; mild RVE with mildly reduced function; mild to moderate TR with moderate to severe pulmonary hypertension.  Patient Profile     76 y.o. male with a history of hypertension, tobacco use, and heart murmur, presenting with progressive dyspnea on exertion, hypertensive urgency, and newly documented atrial fibrillation with mildly abnormal troponin I levels.  Assessment & Plan    1. Persistent atrial fibrillation with CHADSVASC score of 3. He continues on IV heparin as well as oral Cardizem for heart rate control. LVEF 50-55% range with moderately dilated left atrium and mild mitral regurgitation. Reports mild palpitations.  2. Severe pulmonary hypertension by echocardiogram with right ventricular dysfunction. PASP estimate 67 mmHg. Chest CTA did not demonstrate pulmonary embolus.  3. Mild troponin I elevation with peak of 0.20 and in relatively flat pattern. Not clearly consistent with ACS.  4. Uncontrolled blood pressure with history of essential hypertension. Hydralazine added.  5. UDS positive for cocaine.  Rather than pursuing Myoview study, patient will  be scheduled for left and right heart catheterization, we discussed this today. In light of severe pulmonary hypertension by echocardiogram, hemodynamics will be more useful in guiding therapy. We will keep him on IV heparin for now as well as Cardizem and hydralazine. Add aspirin. Follow-up CBC and BMET in a.m.  Signed, Nona Dell, MD  10/05/2017, 9:39 AM

## 2017-10-05 NOTE — Progress Notes (Signed)
ANTICOAGULATION CONSULT NOTE - Follow Up Consult  Pharmacy Consult for heparin Indication: atrial fibrillation  Labs:  Recent Labs  10/03/17 0115 10/03/17 0122 10/03/17 0536 10/03/17 1124 10/03/17 1756  10/04/17 0344 10/04/17 1502 10/05/17 0525  HGB 12.3*  --   --   --   --   --  11.8*  --  13.0  HCT 39.0  --   --   --   --   --  36.4*  --  39.8  PLT 305  --   --   --   --   --  232  --  273  APTT  --  28  --   --   --   --   --   --   --   LABPROT  --  15.1  --   --   --   --   --   --   --   INR  --  1.20  --   --   --   --   --   --   --   HEPARINUNFRC  --   --   --  0.13*  --   < > 0.23* 0.64 0.69  CREATININE 1.49*  --   --   --   --   --  1.25*  --  1.45*  TROPONINI 0.16*  --  0.17* 0.20* 0.19*  --   --   --   --   < > = values in this interval not displayed.  Assessment: 76yo male admitted with SOB/CP found to be in Afib RVR improved with diltiazem 60mg  po q12.  Heparin drip 1700 uts/hr HL at goal 0.69, CBC stable no bleeding noted.  Plan for oral AC after cath 10/22.    Goal of Therapy:  Heparin level 0.3-0.7 units/ml   Plan:  Continue heparin at 1700 units / hr Daily CBC, HL Follow up after cath for oral [facility] plan  Leota Sauers Pharm.D. CPP, BCPS Clinical Pharmacist [phone] 10/05/2017 10:53 AM

## 2017-10-06 ENCOUNTER — Encounter (HOSPITAL_COMMUNITY)
Admission: EM | Disposition: A | Discharge status: Home / Self Care | Payer: Self-pay | Source: Home / Self Care | Attending: Internal Medicine

## 2017-10-06 DIAGNOSIS — R079 Chest pain, unspecified: Secondary | ICD-10-CM

## 2017-10-06 HISTORY — PX: RIGHT/LEFT HEART CATH AND CORONARY ANGIOGRAPHY: CATH118266

## 2017-10-06 LAB — CBC
HCT: 40 % (ref 39.0–52.0)
HEMOGLOBIN: 13.1 g/dL (ref 13.0–17.0)
MCH: 28 pg (ref 26.0–34.0)
MCHC: 32.8 g/dL (ref 30.0–36.0)
MCV: 85.5 fL (ref 78.0–100.0)
Platelets: 256 10*3/uL (ref 150–400)
RBC: 4.68 MIL/uL (ref 4.22–5.81)
RDW: 14.7 % (ref 11.5–15.5)
WBC: 4.6 10*3/uL (ref 4.0–10.5)

## 2017-10-06 LAB — BASIC METABOLIC PANEL
Anion gap: 6 (ref 5–15)
Anion gap: 5 (ref 5–15)
Anion gap: 6 (ref 5–15)
BUN: 17 mg/dL (ref 6–20)
BUN: 17 mg/dL (ref 6–20)
BUN: 15 mg/dL (ref 6–20)
CALCIUM: 8.6 mg/dL — AB (ref 8.9–10.3)
CHLORIDE: 102 mmol/L (ref 101–111)
CHLORIDE: 104 mmol/L (ref 101–111)
CHLORIDE: 105 mmol/L (ref 101–111)
CO2: 26 mmol/L (ref 22–32)
CO2: 26 mmol/L (ref 22–32)
CO2: 26 mmol/L (ref 22–32)
CREATININE: 1.49 mg/dL — AB (ref 0.61–1.24)
CREATININE: 1.41 mg/dL — AB (ref 0.61–1.24)
CREATININE: 1.39 mg/dL — AB (ref 0.61–1.24)
Calcium: 8.5 mg/dL — ABNORMAL LOW (ref 8.9–10.3)
Calcium: 8.7 mg/dL — ABNORMAL LOW (ref 8.9–10.3)
GFR calc Af Amer: 55 mL/min — ABNORMAL LOW (ref >60)
GFR calc Af Amer: 56 mL/min — ABNORMAL LOW (ref >60)
GFR calc non Af Amer: 48 mL/min — ABNORMAL LOW (ref >60)
GFR calc non Af Amer: 48 mL/min — ABNORMAL LOW (ref >60)
GFR, EST AFRICAN AMERICAN: 52 mL/min — AB (ref >60)
GFR, EST NON AFRICAN AMERICAN: 44 mL/min — AB (ref >60)
GLUCOSE: 103 mg/dL — AB (ref 65–99)
Glucose, Bld: 101 mg/dL — ABNORMAL HIGH (ref 65–99)
Glucose, Bld: 98 mg/dL (ref 65–99)
Potassium: 3.8 mmol/L (ref 3.5–5.1)
Potassium: 4 mmol/L (ref 3.5–5.1)
Potassium: 4.7 mmol/L (ref 3.5–5.1)
SODIUM: 134 mmol/L — AB (ref 135–145)
Sodium: 135 mmol/L (ref 135–145)
Sodium: 137 mmol/L (ref 135–145)

## 2017-10-06 LAB — POCT I-STAT 3, VENOUS BLOOD GAS (G3P V)
Acid-Base Excess: 1 mmol/L (ref 0.0–2.0)
Bicarbonate: 25.5 mmol/L (ref 20.0–28.0)
O2 SAT: 67 %
PCO2 VEN: 41.3 mmHg — AB (ref 44.0–60.0)
PO2 VEN: 35 mmHg (ref 32.0–45.0)
TCO2: 27 mmol/L (ref 22–32)
pH, Ven: 7.399 (ref 7.250–7.430)

## 2017-10-06 LAB — HEPARIN LEVEL (UNFRACTIONATED)
HEPARIN UNFRACTIONATED: 0.81 [IU]/mL — AB (ref 0.30–0.70)
Heparin Unfractionated: 0.56 IU/mL (ref 0.30–0.70)

## 2017-10-06 LAB — PROTIME-INR
INR: 1.17
PROTHROMBIN TIME: 14.8 s (ref 11.4–15.2)

## 2017-10-06 LAB — POCT ACTIVATED CLOTTING TIME: ACTIVATED CLOTTING TIME: 197 s

## 2017-10-06 LAB — MAGNESIUM: MAGNESIUM: 1.8 mg/dL (ref 1.7–2.4)

## 2017-10-06 SURGERY — RIGHT/LEFT HEART CATH AND CORONARY ANGIOGRAPHY
Anesthesia: LOCAL

## 2017-10-06 MED ORDER — IOPAMIDOL (ISOVUE-370) INJECTION 76%
INTRAVENOUS | Status: DC | PRN
  Administered 2017-10-06: 50 mL via INTRA_ARTERIAL

## 2017-10-06 MED ORDER — LIDOCAINE HCL 2 % IJ SOLN
INTRAMUSCULAR | Status: DC | PRN
  Administered 2017-10-06 (×2): 5 mL

## 2017-10-06 MED ORDER — HEPARIN (PORCINE) IN NACL 2-0.9 UNIT/ML-% IJ SOLN
INTRAMUSCULAR | Status: AC
  Filled 2017-10-06: qty 1000

## 2017-10-06 MED ORDER — HEPARIN SODIUM (PORCINE) 1000 UNIT/ML IJ SOLN
INTRAMUSCULAR | Status: DC | PRN
  Administered 2017-10-06: 5000 [IU] via INTRAVENOUS

## 2017-10-06 MED ORDER — HEPARIN (PORCINE) IN NACL 2-0.9 UNIT/ML-% IJ SOLN
INTRAMUSCULAR | Status: AC | PRN
  Administered 2017-10-06: 1000 mL

## 2017-10-06 MED ORDER — HEPARIN SODIUM (PORCINE) 1000 UNIT/ML IJ SOLN
INTRAMUSCULAR | Status: AC
  Filled 2017-10-06: qty 1

## 2017-10-06 MED ORDER — SODIUM CHLORIDE 0.9 % IV SOLN
250.0000 mL | INTRAVENOUS | Status: DC | PRN
Start: 2017-10-06 — End: 2017-10-08

## 2017-10-06 MED ORDER — IOPAMIDOL (ISOVUE-370) INJECTION 76%
INTRAVENOUS | Status: AC
  Filled 2017-10-06: qty 100

## 2017-10-06 MED ORDER — SODIUM CHLORIDE 0.9% FLUSH
3.0000 mL | Freq: Two times a day (BID) | INTRAVENOUS | Status: DC
  Administered 2017-10-07: 3 mL via INTRAVENOUS

## 2017-10-06 MED ORDER — MIDAZOLAM HCL 2 MG/2ML IJ SOLN
INTRAMUSCULAR | Status: AC
  Filled 2017-10-06: qty 2

## 2017-10-06 MED ORDER — SODIUM CHLORIDE 0.9 % IV SOLN: INTRAVENOUS | Status: AC

## 2017-10-06 MED ORDER — VERAPAMIL HCL 2.5 MG/ML IV SOLN
INTRAVENOUS | Status: AC
  Filled 2017-10-06: qty 2

## 2017-10-06 MED ORDER — ACETAMINOPHEN 325 MG PO TABS: 650.0000 mg | ORAL_TABLET | ORAL | Status: DC | PRN

## 2017-10-06 MED ORDER — APIXABAN 5 MG PO TABS: 5.0000 mg | ORAL_TABLET | Freq: Two times a day (BID) | ORAL | Status: DC

## 2017-10-06 MED ORDER — SODIUM CHLORIDE 0.9% FLUSH: 3.0000 mL | INTRAVENOUS | Status: DC | PRN

## 2017-10-06 MED ORDER — MIDAZOLAM HCL 2 MG/2ML IJ SOLN
INTRAMUSCULAR | Status: DC | PRN
  Administered 2017-10-06: 1 mg via INTRAVENOUS

## 2017-10-06 MED ORDER — LIDOCAINE HCL 2 % IJ SOLN
INTRAMUSCULAR | Status: AC
  Filled 2017-10-06: qty 10

## 2017-10-06 MED ORDER — HEPARIN SODIUM (PORCINE) 5000 UNIT/ML IJ SOLN: 5000.0000 [IU] | Freq: Three times a day (TID) | INTRAMUSCULAR | Status: DC

## 2017-10-06 MED ORDER — FENTANYL CITRATE (PF) 100 MCG/2ML IJ SOLN
INTRAMUSCULAR | Status: AC
  Filled 2017-10-06: qty 2

## 2017-10-06 MED ORDER — FENTANYL CITRATE (PF) 100 MCG/2ML IJ SOLN
INTRAMUSCULAR | Status: DC | PRN
  Administered 2017-10-06: 25 ug via INTRAVENOUS

## 2017-10-06 MED ORDER — SODIUM CHLORIDE 0.9 % IV SOLN
INTRAVENOUS | Status: DC
  Administered 2017-10-06 – 2017-10-07 (×2): via INTRAVENOUS

## 2017-10-06 MED ORDER — VERAPAMIL HCL 2.5 MG/ML IV SOLN
INTRAVENOUS | Status: DC | PRN
  Administered 2017-10-06: 10 mL via INTRA_ARTERIAL

## 2017-10-06 MED ORDER — ONDANSETRON HCL 4 MG/2ML IJ SOLN: 4.0000 mg | Freq: Four times a day (QID) | INTRAMUSCULAR | Status: DC | PRN

## 2017-10-06 SURGICAL SUPPLY — 11 items
CATH BALLN WEDGE 5F 110CM (CATHETERS) ×2 IMPLANT
CATH IMPULSE 5F ANG/FL3.5 (CATHETERS) ×2 IMPLANT
DEVICE RAD COMP TR BAND LRG (VASCULAR PRODUCTS) ×2 IMPLANT
GLIDESHEATH SLEND SS 6F .021 (SHEATH) ×2 IMPLANT
GUIDEWIRE INQWIRE 1.5J.035X260 (WIRE) ×1 IMPLANT
INQWIRE 1.5J .035X260CM (WIRE) ×2
KIT HEART LEFT (KITS) ×2 IMPLANT
PACK CARDIAC CATHETERIZATION (CUSTOM PROCEDURE TRAY) ×2 IMPLANT
SHEATH GLIDE SLENDER 4/5FR (SHEATH) ×2 IMPLANT
TRANSDUCER W/STOPCOCK (MISCELLANEOUS) ×2 IMPLANT
TUBING CIL FLEX 10 FLL-RA (TUBING) ×2 IMPLANT

## 2017-10-06 NOTE — Progress Notes (Signed)
ANTICOAGULATION CONSULT NOTE - Follow Up Consult  Pharmacy Consult for heparin Indication: atrial fibrillation  Labs:  Recent Labs  10/03/17 1756  10/04/17 0344  10/05/17 0525 10/06/17 0336 10/06/17 0835 10/06/17 1228  HGB  --   < > 11.8*  --  13.0 13.1  --   --   HCT  --   --  36.4*  --  39.8 40.0  --   --   PLT  --   --  232  --  273 256  --   --   LABPROT  --   --   --   --   --  14.8  --   --   INR  --   --   --   --   --  1.17  --   --   HEPARINUNFRC  --   < > 0.23*  < > 0.69 0.81*  --  0.56  CREATININE  --   < > 1.25*  --  1.45* 1.49* 1.41* 1.39*  TROPONINI 0.19*  --   --   --   --   --   --   --   < > = values in this interval not displayed.  Assessment: 76yo male admitted with SOB/CP found to be in Afib RVR improved with diltiazem 60mg  po q12.   Heparin level came back elevated earlier today on 1700 units/hr. Repeat level came back within goal range at 0.56, on 1600 units/hr. CBC remains stable. No signs/symptoms of bleeding. Plan for cath today- plan for oral anticoagulation following procedure.    Goal of Therapy:  Heparin level 0.3-0.7 units/ml   Plan:  Continue heparin at 1600 units / hr Daily CBC, HL Follow up after cath for oral [facility] plan  Girard Cooter, PharmD Clinical Pharmacist  Pager: [phone] Clinical Phone for 10/06/2017 until 3:30pm: [phone] If after 3:30pm, please call main pharmacy at [phone] 10/06/2017 1:48 PM

## 2017-10-06 NOTE — Progress Notes (Signed)
Removed TR band from right radial. Site is a level 0. Applied gauze and Tegaderm. Educated pt to leave in place for 24 hrs and avoid getting wet. Will continue to monitor.

## 2017-10-06 NOTE — Interval H&P Note (Signed)
History and Physical Interval Note:  10/06/2017 3:38 PM  Adam Willis  has presented today for surgery, with the diagnosis of NSTEMI  The various methods of treatment have been discussed with the patient and family. After consideration of risks, benefits and other options for treatment, the patient has consented to  Procedure(s): RIGHT/LEFT HEART CATH AND CORONARY ANGIOGRAPHY (N/A) as a surgical intervention .  The patient's history has been reviewed, patient examined, no change in status, stable for surgery.  I have reviewed the patient's chart and labs.  Questions were answered to the patient's satisfaction.     Sherell Christoffel [employer]

## 2017-10-06 NOTE — Care Management Note (Addendum)
Case Management Note  Patient Details  Name: Adam Willis MRN: [id number] Date of Birth: 21-Apr-1941  Subjective/Objective:  Pt presented for Shortness of Breath- found to be hypotensive and in Atrial Fib. Plan for home on Xarelto vs Eliquis. Benefits Check in process.                   Action/Plan: CM will make pt aware of cost once completed.   Expected Discharge Date:                  Expected Discharge Plan:  Home/Self Care  In-House Referral:  NA  Discharge planning Services  CM Consult, Medication Assistance  Post Acute Care Choice:  NA Choice offered to:  NA  DME Arranged:  N/A DME Agency:  NA  HH Arranged:  NA HH Agency:  NA  Status of Service:  Completed, signed off  If discussed at Long Length of Stay Meetings, dates discussed:    Additional Comments: 1722 10-06-17 Pt is without a Part D Rx drug plan. Pt will have to pay out of pocket for Xarelto and Eliquis at cost. Pt unable to do this at this time. No further needs at this time.  Gala Lewandowsky, RN 10/06/2017, 10:33 AM

## 2017-10-06 NOTE — Progress Notes (Signed)
PROGRESS NOTE  Adam CedarKenneth Rawl UJW:[id number] DOB: October 08, 1941 DOA: 10/03/2017 PCP: [facility]  HPI/Recap of past 2524 hours: 76 year old male with a history of hypertension, medical compliance, tobacco abuse, cocaine abuse presents to the ED with complaints of shortness of breath and palpitation.  Patient was found to be in hypertensive urgency, A. fib with RVR and mildly abnormal troponin levels.  Urine drug screen was also positive for cocaine.  Today, patient's A. fib was controlled with p.o. Cardizem, blood pressure controlled as well.  Patient reports feeling well overall, denies any chest pain, worsening shortness of breath, palpitations, abdominal pain, nausea, vomiting, diarrhea, dizziness.  Due to severe pulmonary hypertension noted on echo, patient is scheduled for a right and left heart cardiac catheterization.  Assessment/Plan: Principal Problem:   Atrial fibrillation with RVR (HCC) Active Problems:   Acute CHF (congestive heart failure) (HCC)   Acute renal failure (ARF) (HCC)   Normocytic normochromic anemia   Tobacco abuse   Hypertensive urgency   New onset a-fib (HCC)  #New onset A. fib with RVR Rate controlled Chads vas score is 3 TSH within normal limits EKG shows A. fib Echo showed LVEF 50-55% with moderately dilated left atrium, severe pulmonary hypertension with right ventricular dysfunction.  PASP estimated at 67 mmHg Continue oral Cardizem, and IV heparin for anticoagulation Monitor electrolytes Cardiology on board  #Severe Pulmonary hypertension Echo as above, with PASP estimated at 67 mmHg Unclear etiology CT angio chest showed no evidence of any thromboembolic disease Cardiology on board, recommend left and right heart catheterization today  #Mild troponin elevation Overall relatively flat pattern, unlikely ACS Possible demand ischemia  #Hypertensive urgency Controlled Chest x-ray with congestion, ??  Flash pulmonary edema Continue  hydralazine, Cardizem, Lasix  #AK I Stable Daily BMP  #Prediabetic A1c 6 PCP to follow up on A1c Lifestyle modification  #Substance abuse and tobacco abuse Patient had been abusing cocaine for about 10 years now Urine drug screen positive for cocaine Patient advised to quit    Code Status: Full  Family Communication: Discussed with patient and significant other  Disposition Plan: Home   Consultants:  Cardiology  Procedures: Right and left cardiac catheterization 10/06/17  Antimicrobials:  None  DVT prophylaxis: IV heparin   Objective: Vitals:   10/05/17 1953 10/06/17 0410 10/06/17 1100 10/06/17 1413  BP: (!) 142/84 (!) 134/91  (!) 149/93  Pulse: 84 72    Resp: (!) 23 15    Temp: 98.5 F (36.9 C) 97.9 F (36.6 C) 98 F (36.7 C)   TempSrc: Oral Oral Axillary   SpO2: 98% 100% 100%   Weight:  97 kg (213 lb 14.4 oz)    Height:        Intake/Output Summary (Last 24 hours) at 10/06/17 1649 Last data filed at 10/06/17 1500  Gross per 24 hour  Intake           1315.7 ml  Output              801 ml  Net            514.7 ml   Filed Weights   10/03/17 1619 10/05/17 0318 10/06/17 0410  Weight: 102.2 kg (225 lb 4.8 oz) 97.2 kg (214 lb 3.2 oz) 97 kg (213 lb 14.4 oz)    Exam:   General: Awake, alert, oriented x3  Cardiovascular: Irregularly irregular heart sounds  Respiratory: Clear to auscultation bilaterally  Abdomen: Soft, nontender, nondistended, bowel sounds present  Musculoskeletal: No pedal edema  Skin: Normal  Psychiatry: Normal mood   Data Reviewed: CBC:  Recent Labs Lab 10/03/17 0115 10/04/17 0344 10/05/17 0525 10/06/17 0336  WBC 6.5 4.8 5.3 4.6  HGB 12.3* 11.8* 13.0 13.1  HCT 39.0 36.4* 39.8 40.0  MCV 85.7 84.8 85.0 85.5  PLT 305 232 273 256   Basic Metabolic Panel:  Recent Labs Lab 10/03/17 0115 10/04/17 0344 10/05/17 0525 10/06/17 0336 10/06/17 0835 10/06/17 1228  NA 141 138 136 134* 135 137  K 3.2* 3.8 3.5  3.8 4.0 4.7  CL 106 103 101 102 104 105  CO2 25 25 26 26 26 26   GLUCOSE 137* 92 123* 101* 103* 98  BUN 15 13 16 17 17 15   CREATININE 1.49* 1.25* 1.45* 1.49* 1.41* 1.39*  CALCIUM 8.9 8.7* 8.9 8.6* 8.5* 8.7*  MG 1.7  --   --  1.8  --   --    GFR: Estimated Creatinine Clearance: 55.7 mL/min (A) (by C-G formula based on SCr of 1.39 mg/dL (H)). Liver Function Tests: No results for input(s): AST, ALT, ALKPHOS, BILITOT, PROT, ALBUMIN in the last 168 hours. No results for input(s): LIPASE, AMYLASE in the last 168 hours. No results for input(s): AMMONIA in the last 168 hours. Coagulation Profile:  Recent Labs Lab 10/03/17 0122 10/06/17 0336  INR 1.20 1.17   Cardiac Enzymes:  Recent Labs Lab 10/03/17 0115 10/03/17 0536 10/03/17 1124 10/03/17 1756  TROPONINI 0.16* 0.17* 0.20* 0.19*   BNP (last 3 results) No results for input(s): PROBNP in the last 8760 hours. HbA1C: No results for input(s): HGBA1C in the last 72 hours. CBG: No results for input(s): GLUCAP in the last 168 hours. Lipid Profile: No results for input(s): CHOL, HDL, LDLCALC, TRIG, CHOLHDL, LDLDIRECT in the last 72 hours. Thyroid Function Tests: No results for input(s): TSH, T4TOTAL, FREET4, T3FREE, THYROIDAB in the last 72 hours. Anemia Panel: No results for input(s): VITAMINB12, FOLATE, FERRITIN, TIBC, IRON, RETICCTPCT in the last 72 hours. Urine analysis:    Component Value Date/Time   COLORURINE COLORLESS (A) 10/03/2017 2153   APPEARANCEUR CLEAR 10/03/2017 2153   LABSPEC 1.005 10/03/2017 2153   PHURINE 6.0 10/03/2017 2153   GLUCOSEU NEGATIVE 10/03/2017 2153   HGBUR NEGATIVE 10/03/2017 2153   BILIRUBINUR NEGATIVE 10/03/2017 2153   KETONESUR NEGATIVE 10/03/2017 2153   PROTEINUR NEGATIVE 10/03/2017 2153   NITRITE NEGATIVE 10/03/2017 2153   LEUKOCYTESUR NEGATIVE 10/03/2017 2153   Sepsis Labs: @LABRCNTIP (procalcitonin:4,lacticidven:4)  )No results found for this or any previous visit (from the past 240  hour(s)).    Studies: No results found.  Scheduled Meds: . aspirin EC  81 mg Oral Daily  . diltiazem  60 mg Oral Q12H  . heparin  5,000 Units Subcutaneous Q8H  . hydrALAZINE  25 mg Oral Q8H  . potassium chloride  40 mEq Oral Daily  . sodium chloride flush  3 mL Intravenous Q12H    Continuous Infusions: . sodium chloride 100 mL/hr at 10/06/17 1339  . sodium chloride    . sodium chloride    . heparin 1,600 Units/hr (10/06/17 1338)     LOS: 2 days     Adam Cedar, MD [facility] Pager [phone]  If 7PM-7AM, please contact night-coverage [email or web address] Password [facility] 10/06/2017, 4:49 PM

## 2017-10-06 NOTE — H&P (View-Only) (Signed)
Progress Note  Patient Name: Adam Willis Date of Encounter: 10/05/2017  Primary Cardiologist: Dr. Armanda Magicraci Turner  Subjective   No chest pain or breathlessness at rest. Mild sense of palpitations. No abdominal pain or emesis  Inpatient Medications    Scheduled Meds: . diltiazem  60 mg Oral Q12H  . furosemide  40 mg Intravenous Daily  . hydrALAZINE  25 mg Oral Q8H  . potassium chloride  40 mEq Oral Daily   Continuous Infusions: . heparin 1,700 Units/hr (10/05/17 0615)   PRN Meds: acetaminophen **OR** acetaminophen, hydrALAZINE, ondansetron **OR** ondansetron (ZOFRAN) IV   Vital Signs    Vitals:   10/04/17 2210 10/04/17 2248 10/05/17 0318 10/05/17 0532  BP: (!) 170/116 (!) 149/105 (!) 151/101 113/80  Pulse:   81   Resp:  (!) 23 (!) 21   Temp:   98.2 F (36.8 C)   TempSrc:   Oral   SpO2: 100%  97%   Weight:   214 lb 3.2 oz (97.2 kg)   Height:        Intake/Output Summary (Last 24 hours) at 10/05/17 0939 Last data filed at 10/05/17 0300  Gross per 24 hour  Intake           768.81 ml  Output              401 ml  Net           367.81 ml   Filed Weights   10/03/17 0111 10/03/17 1619 10/05/17 0318  Weight: 220 lb (99.8 kg) 225 lb 4.8 oz (102.2 kg) 214 lb 3.2 oz (97.2 kg)    Telemetry    Atrial fibrillation. Personally reviewed.  Physical Exam   GEN: Overweight male. No acute distress.   Neck: No JVD. Cardiac: Irregularly irregular, increased second heart sound, no gallop.  Respiratory: Nonlabored. Clear to auscultation bilaterally. GI: Soft, nontender, bowel sounds present. MS: No edema; No deformity. Neuro:  Nonfocal. Psych: Alert and oriented x 3. Normal affect.  Labs    Chemistry  Recent Labs Lab 10/03/17 0115 10/04/17 0344 10/05/17 0525  NA 141 138 136  K 3.2* 3.8 3.5  CL 106 103 101  CO2 25 25 26   GLUCOSE 137* 92 123*  BUN 15 13 16   CREATININE 1.49* 1.25* 1.45*  CALCIUM 8.9 8.7* 8.9  GFRNONAA 44* 55* 46*  GFRAA 52* >60 53*    ANIONGAP 10 10 9      Hematology  Recent Labs Lab 10/03/17 0115 10/04/17 0344 10/05/17 0525  WBC 6.5 4.8 5.3  RBC 4.55 4.29 4.68  HGB 12.3* 11.8* 13.0  HCT 39.0 36.4* 39.8  MCV 85.7 84.8 85.0  MCH 27.0 27.5 27.8  MCHC 31.5 32.4 32.7  RDW 14.5 14.7 14.7  PLT 305 232 273    Cardiac Enzymes  Recent Labs Lab 10/03/17 0115 10/03/17 0536 10/03/17 1124 10/03/17 1756  TROPONINI 0.16* 0.17* 0.20* 0.19*   No results for input(s): TROPIPOC in the last 168 hours.   BNP  Recent Labs Lab 10/03/17 0115  BNP 614.9*     Radiology    Ct Angio Chest Pe W Or Wo Contrast  Result Date: 10/04/2017 CLINICAL DATA:  Shortness of breath for the past 2 days.  No pain. EXAM: CT ANGIOGRAPHY CHEST WITH CONTRAST TECHNIQUE: Multidetector CT imaging of the chest was performed using the standard protocol during bolus administration of intravenous contrast. Multiplanar CT image reconstructions and MIPs were obtained to evaluate the vascular anatomy. CONTRAST:  80 mL Isovue 370  COMPARISON:  None. FINDINGS: Cardiovascular: Satisfactory opacification of the pulmonary arteries to the segmental level. No evidence of pulmonary embolism. Stable cardiomegaly. No pericardial effusion. Normal caliber thoracic aorta. No thoracic aortic aneurysm. Mediastinum/Nodes: No enlarged mediastinal, hilar, or axillary lymph nodes. Thyroid gland, trachea, and esophagus demonstrate no significant findings. Lungs/Pleura: Small right pleural effusion. Bibasilar dependent atelectasis. Bilateral centrilobular emphysema in the upper lobes. No pneumothorax. Upper Abdomen: No acute abnormality. Musculoskeletal: No chest wall abnormality. No acute or significant osseous findings. Review of the MIP images confirms the above findings. IMPRESSION: 1. No evidence of pulmonary embolus. 2. Small right pleural effusion.  Stable cardiomegaly. Electronically Signed   By: Elige Ko   On: 10/04/2017 13:28    Cardiac Studies   Echocardiogram  10/03/2017: Study Conclusions  - Left ventricle: The cavity size was normal. Wall thickness was increased in a pattern of moderate LVH. Systolic function was normal. The estimated ejection fraction was in the range of 50% to 55%. Wall motion was normal; there were no regional wall motion abnormalities. - Mitral valve: There was mild regurgitation. - Left atrium: The atrium was moderately dilated. - Right ventricle: The cavity size was mildly dilated. Systolic function was mildly reduced. - Right atrium: The atrium was mildly dilated. - Pulmonary arteries: Systolic pressure was moderately to severely increased. PA peak pressure: 67 mm Hg (S). - Pericardium, extracardiac: A trivial pericardial effusion was identified.  Impressions:  - Normal LV systolic function; moderate LVH; mild MR; biatrial enlargement; mild RVE with mildly reduced function; mild to moderate TR with moderate to severe pulmonary hypertension.  Patient Profile     76 y.o. male with a history of hypertension, tobacco use, and heart murmur, presenting with progressive dyspnea on exertion, hypertensive urgency, and newly documented atrial fibrillation with mildly abnormal troponin I levels.  Assessment & Plan    1. Persistent atrial fibrillation with CHADSVASC score of 3. He continues on IV heparin as well as oral Cardizem for heart rate control. LVEF 50-55% range with moderately dilated left atrium and mild mitral regurgitation. Reports mild palpitations.  2. Severe pulmonary hypertension by echocardiogram with right ventricular dysfunction. PASP estimate 67 mmHg. Chest CTA did not demonstrate pulmonary embolus.  3. Mild troponin I elevation with peak of 0.20 and in relatively flat pattern. Not clearly consistent with ACS.  4. Uncontrolled blood pressure with history of essential hypertension. Hydralazine added.  5. UDS positive for cocaine.  Rather than pursuing Myoview study, patient will  be scheduled for left and right heart catheterization, we discussed this today. In light of severe pulmonary hypertension by echocardiogram, hemodynamics will be more useful in guiding therapy. We will keep him on IV heparin for now as well as Cardizem and hydralazine. Add aspirin. Follow-up CBC and BMET in a.m.  Signed, Nona Dell, MD  10/05/2017, 9:39 AM

## 2017-10-06 NOTE — Progress Notes (Signed)
ANTICOAGULATION CONSULT NOTE - Follow Up Consult  Pharmacy Consult for heparin Indication: atrial fibrillation  Labs:  Recent Labs  10/03/17 1124 10/03/17 1756  10/04/17 0344 10/04/17 1502 10/05/17 0525 10/06/17 0336  HGB  --   --   < > 11.8*  --  13.0 13.1  HCT  --   --   --  36.4*  --  39.8 40.0  PLT  --   --   --  232  --  273 256  LABPROT  --   --   --   --   --   --  14.8  INR  --   --   --   --   --   --  1.17  HEPARINUNFRC 0.13*  --   < > 0.23* 0.64 0.69 0.81*  CREATININE  --   --   --  1.25*  --  1.45* 1.49*  TROPONINI 0.20* 0.19*  --   --   --   --   --   < > = values in this interval not displayed.   Assessment: 76yo male now slightly above goal on heparin after two levels at upper end of goal; no gtt issues or signs of bleeding per RN.  Goal of Therapy:  Heparin level 0.3-0.7 units/ml   Plan:  Will decrease heparin gtt by 1 unit/kg/hr to 1600 units/hr and check level in 6hr.  Vernard Gambles, PharmD, BCPS  10/06/2017,5:37 AM

## 2017-10-07 ENCOUNTER — Telehealth: Payer: Self-pay | Admitting: Cardiology

## 2017-10-07 ENCOUNTER — Encounter (HOSPITAL_COMMUNITY): Payer: Self-pay | Admitting: Cardiology

## 2017-10-07 LAB — BASIC METABOLIC PANEL
Anion gap: 7 (ref 5–15)
BUN: 13 mg/dL (ref 6–20)
CALCIUM: 8.5 mg/dL — AB (ref 8.9–10.3)
CO2: 25 mmol/L (ref 22–32)
CREATININE: 1.43 mg/dL — AB (ref 0.61–1.24)
Chloride: 103 mmol/L (ref 101–111)
GFR, EST AFRICAN AMERICAN: 54 mL/min — AB (ref >60)
GFR, EST NON AFRICAN AMERICAN: 47 mL/min — AB (ref >60)
Glucose, Bld: 123 mg/dL — ABNORMAL HIGH (ref 65–99)
Potassium: 4.2 mmol/L (ref 3.5–5.1)
Sodium: 135 mmol/L (ref 135–145)

## 2017-10-07 LAB — CBC
HEMATOCRIT: 37.1 % — AB (ref 39.0–52.0)
HEMOGLOBIN: 11.9 g/dL — AB (ref 13.0–17.0)
MCH: 27.5 pg (ref 26.0–34.0)
MCHC: 32.1 g/dL (ref 30.0–36.0)
MCV: 85.7 fL (ref 78.0–100.0)
Platelets: 233 10*3/uL (ref 150–400)
RBC: 4.33 MIL/uL (ref 4.22–5.81)
RDW: 14.7 % (ref 11.5–15.5)
WBC: 4.6 10*3/uL (ref 4.0–10.5)

## 2017-10-07 LAB — HEPATIC FUNCTION PANEL
ALT: 42 U/L (ref 17–63)
AST: 28 U/L (ref 15–41)
Albumin: 3.6 g/dL (ref 3.5–5.0)
Alkaline Phosphatase: 110 U/L (ref 38–126)
BILIRUBIN DIRECT: 0.2 mg/dL (ref 0.1–0.5)
BILIRUBIN INDIRECT: 0.9 mg/dL (ref 0.3–0.9)
BILIRUBIN TOTAL: 1.1 mg/dL (ref 0.3–1.2)
Total Protein: 7.3 g/dL (ref 6.5–8.1)

## 2017-10-07 LAB — PROTIME-INR
INR: 1.12
PROTHROMBIN TIME: 14.3 s (ref 11.4–15.2)

## 2017-10-07 LAB — MAGNESIUM: MAGNESIUM: 1.9 mg/dL (ref 1.7–2.4)

## 2017-10-07 MED ORDER — DILTIAZEM HCL ER COATED BEADS 240 MG PO CP24
240.0000 mg | ORAL_CAPSULE | Freq: Every day | ORAL | Status: DC
  Administered 2017-10-07 – 2017-10-08 (×2): 240 mg via ORAL
  Filled 2017-10-07 (×3): qty 1

## 2017-10-07 MED ORDER — DILTIAZEM HCL ER COATED BEADS 240 MG PO CP24: 240.0000 mg | ORAL_CAPSULE | Freq: Every day | ORAL | Status: DC

## 2017-10-07 MED ORDER — WARFARIN - PHARMACIST DOSING INPATIENT: Freq: Every day | Status: DC

## 2017-10-07 MED ORDER — WARFARIN VIDEO: Freq: Once | Status: DC

## 2017-10-07 MED ORDER — WARFARIN SODIUM 5 MG PO TABS
5.0000 mg | ORAL_TABLET | Freq: Once | ORAL | Status: AC
  Administered 2017-10-07: 5 mg via ORAL
  Filled 2017-10-07: qty 1

## 2017-10-07 MED ORDER — COUMADIN BOOK
Freq: Once | Status: AC
  Administered 2017-10-07: 15:00:00
  Filled 2017-10-07: qty 1

## 2017-10-07 NOTE — Progress Notes (Signed)
PROGRESS NOTE  Adam CedarKenneth Willis WUJ:[id number] DOB: 09/25/1941 DOA: 10/03/2017 PCP: [facility]  HPI/Recap of past 5224 hours: 76 year old male with a history of hypertension, medical noncompliance, tobacco abuse, cocaine abuse is currently being managed for hypertensive urgency, A. fib with RVR and mildly abnormal troponins likely due to demand ischemia.  Urine drug screen was also noted to be positive for cocaine.  Today, patient reports feeling well overall, denies any chest pain, worsening shortness of breath, palpitations, abdominal pain, nausea, vomiting, diarrhea, dizziness.  Assessment/Plan: Principal Problem:   Atrial fibrillation with RVR (HCC) Active Problems:   Acute CHF (congestive heart failure) (HCC)   Acute renal failure (ARF) (HCC)   Normocytic normochromic anemia   Tobacco abuse   Hypertensive urgency   New onset a-fib (HCC)  #New onset A. fib with RVR Rate controlled Chadsvasc score is 3 TSH within normal limits Echo showed LVEF 50-55% with moderately dilated left atrium, severe pulmonary hypertension with right ventricular dysfunction.  PASP estimated at 67 mmHg Due to the above echo report a CT angio and left and right heart catheterization was performed Discharge patient on oral Cardizem and Coumadin for anticoagulation  #Pulmonary hypertension Echo report as above CT angios showed no evidence of any thromboembolic disease Right/left heart cath showed just mild pulmonary hypertension and no significant coronary disease.  Initial echo report with severe pulmonary hypertension noted may have been due to the acute effects of cocaine use  Mild troponin elevation Unlikely ACS, as cardiac cath showed no significant coronary disease Possible demand ischemia  #Hypertensive urgency Controlled Discharge on hydralazine, Cardizem  #AK I Creatinine of 1.43 Worsening likely due to recent imaging/catheterization Hydrate patient overnight with IV fluids  100 cc/h Repeat BMP in a.m.  #Prediabetes A1c 6 PCP to follow-up on management Advised lifestyle modification  #Substance and tobacco abuse Urine drug screen positive for cocaine Patient advised to quit     Code Status: Full  Family Communication: Discussed with patient and significant other  Disposition Plan: Home   Consultants:  Cardiology  Procedures:  Right and left cardiac catheterization on 10/06/17  Antimicrobials:  None  DVT prophylaxis: Coumadin   Objective: Vitals:   10/06/17 1813 10/06/17 2051 10/07/17 0600 10/07/17 1300  BP: (!) 184/85 (!) 146/87 112/87   Pulse: 70 85 63   Resp: (!) 24 (!) 23 19 20   Temp:  98.4 F (36.9 C) 98 F (36.7 C) 98.5 F (36.9 C)  TempSrc:  Oral Oral Oral  SpO2: 98% 98% 97% 96%  Weight:   99.3 kg (218 lb 14.4 oz)   Height:        Intake/Output Summary (Last 24 hours) at 10/07/17 1941 Last data filed at 10/07/17 1700  Gross per 24 hour  Intake          1863.33 ml  Output             1475 ml  Net           388.33 ml   Filed Weights   10/05/17 0318 10/06/17 0410 10/07/17 0600  Weight: 97.2 kg (214 lb 3.2 oz) 97 kg (213 lb 14.4 oz) 99.3 kg (218 lb 14.4 oz)    Exam:   General: Awake alert oriented x3  Cardiovascular: Irregularly irregular heart sounds  Respiratory: Clear to auscultation bilaterally  Abdomen: Soft, nontender, nondistended, bowel sounds present  Musculoskeletal: No pedal edema  Skin: Normal  Psychiatry: Normal mood   Data Reviewed: CBC:  Recent Labs Lab 10/03/17 0115  10/04/17 0344 10/05/17 0525 10/06/17 0336 10/07/17 0428  WBC 6.5 4.8 5.3 4.6 4.6  HGB 12.3* 11.8* 13.0 13.1 11.9*  HCT 39.0 36.4* 39.8 40.0 37.1*  MCV 85.7 84.8 85.0 85.5 85.7  PLT 305 232 273 256 233   Basic Metabolic Panel:  Recent Labs Lab 10/03/17 0115  10/05/17 0525 10/06/17 0336 10/06/17 0835 10/06/17 1228 10/07/17 0428  NA 141  < > 136 134* 135 137 135  K 3.2*  < > 3.5 3.8 4.0 4.7 4.2  CL  106  < > 101 102 104 105 103  CO2 25  < > 26 26 26 26 25   GLUCOSE 137*  < > 123* 101* 103* 98 123*  BUN 15  < > 16 17 17 15 13   CREATININE 1.49*  < > 1.45* 1.49* 1.41* 1.39* 1.43*  CALCIUM 8.9  < > 8.9 8.6* 8.5* 8.7* 8.5*  MG 1.7  --   --  1.8  --   --  1.9  < > = values in this interval not displayed. GFR: Estimated Creatinine Clearance: 54.2 mL/min (A) (by C-G formula based on SCr of 1.43 mg/dL (H)). Liver Function Tests:  Recent Labs Lab 10/07/17 1143  AST 28  ALT 42  ALKPHOS 110  BILITOT 1.1  PROT 7.3  ALBUMIN 3.6   No results for input(s): LIPASE, AMYLASE in the last 168 hours. No results for input(s): AMMONIA in the last 168 hours. Coagulation Profile:  Recent Labs Lab 10/03/17 0122 10/06/17 0336 10/07/17 1143  INR 1.20 1.17 1.12   Cardiac Enzymes:  Recent Labs Lab 10/03/17 0115 10/03/17 0536 10/03/17 1124 10/03/17 1756  TROPONINI 0.16* 0.17* 0.20* 0.19*   BNP (last 3 results) No results for input(s): PROBNP in the last 8760 hours. HbA1C: No results for input(s): HGBA1C in the last 72 hours. CBG: No results for input(s): GLUCAP in the last 168 hours. Lipid Profile: No results for input(s): CHOL, HDL, LDLCALC, TRIG, CHOLHDL, LDLDIRECT in the last 72 hours. Thyroid Function Tests: No results for input(s): TSH, T4TOTAL, FREET4, T3FREE, THYROIDAB in the last 72 hours. Anemia Panel: No results for input(s): VITAMINB12, FOLATE, FERRITIN, TIBC, IRON, RETICCTPCT in the last 72 hours. Urine analysis:    Component Value Date/Time   COLORURINE COLORLESS (A) 10/03/2017 2153   APPEARANCEUR CLEAR 10/03/2017 2153   LABSPEC 1.005 10/03/2017 2153   PHURINE 6.0 10/03/2017 2153   GLUCOSEU NEGATIVE 10/03/2017 2153   HGBUR NEGATIVE 10/03/2017 2153   BILIRUBINUR NEGATIVE 10/03/2017 2153   KETONESUR NEGATIVE 10/03/2017 2153   PROTEINUR NEGATIVE 10/03/2017 2153   NITRITE NEGATIVE 10/03/2017 2153   LEUKOCYTESUR NEGATIVE 10/03/2017 2153   Sepsis  Labs: @LABRCNTIP (procalcitonin:4,lacticidven:4)  )No results found for this or any previous visit (from the past 240 hour(s)).    Studies: No results found.  Scheduled Meds: . diltiazem  240 mg Oral Daily  . hydrALAZINE  25 mg Oral Q8H  . sodium chloride flush  3 mL Intravenous Q12H  . warfarin   Does not apply Once  . Warfarin - Pharmacist Dosing Inpatient   Does not apply q1800    Continuous Infusions: . sodium chloride Stopped (10/07/17 0130)  . sodium chloride       LOS: 3 days     Adam Cedar, MD [facility] Pager [phone]  If 7PM-7AM, please contact night-coverage [email or web address] Password [facility] 10/07/2017, 7:41 PM

## 2017-10-07 NOTE — Progress Notes (Signed)
ANTICOAGULATION CONSULT NOTE - Initial Consult  Pharmacy Consult for warfarin Indication: atrial fibrillation  No Known Allergies  Patient Measurements: Height: 6\' 3"  (190.5 cm) Weight: 218 lb 14.4 oz (99.3 kg) IBW/kg (Calculated) : 84.5  Vital Signs: Temp: 98 F (36.7 C) (10/23 0600) Temp Source: Oral (10/23 0600) BP: 112/87 (10/23 0600) Pulse Rate: 63 (10/23 0600)  Labs:  Recent Labs  10/05/17 0525 10/06/17 0336 10/06/17 0835 10/06/17 1228 10/07/17 0428  HGB 13.0 13.1  --   --  11.9*  HCT 39.8 40.0  --   --  37.1*  PLT 273 256  --   --  233  LABPROT  --  14.8  --   --   --   INR  --  1.17  --   --   --   HEPARINUNFRC 0.69 0.81*  --  0.56  --   CREATININE 1.45* 1.49* 1.41* 1.39* 1.43*    Estimated Creatinine Clearance: 54.2 mL/min (A) (by C-G formula based on SCr of 1.43 mg/dL (H)).   Medical History: Past Medical History:  Diagnosis Date  . Heart murmur   . Hypertension     Medications:  Scheduled:  . coumadin book   Does not apply Once  . diltiazem  240 mg Oral Daily  . hydrALAZINE  25 mg Oral Q8H  . sodium chloride flush  3 mL Intravenous Q12H  . warfarin  5 mg Oral ONCE-1800  . warfarin   Does not apply Once  . Warfarin - Pharmacist Dosing Inpatient   Does not apply q1800    Assessment: 76 yom found to have newly documented atrial fibrillation. CHADsVASc score is 3. Was on heparin prior to cath with plan to start apixaban today- no coverage for NOAC agents. Plan to change to warfarin, with no bridge per cardiology.   INR yesterday was 1.17. No anticoagulation prior to admission. No major medication interactions with warfarin. Hgb dropped slightly from 13.1 to 11.9 and platelets are stable within normal limits. No signs/symptoms of bleeding noted in chart.   Goal of Therapy:  INR 2-3 Monitor platelets by anticoagulation protocol: Yes   Plan:  1) Warfarin 5 mg x1 tonight 2) Monitor daily INR, CBC as needed  3) Ordered warfarin book and video to  supplement patient education  Girard Cooter, PharmD Clinical Pharmacist  Pager: [phone] Clinical Phone for 10/07/2017 until 3:30pm: [phone] If after 3:30pm, please call main pharmacy at [phone] 10/07/2017,11:25 AM

## 2017-10-07 NOTE — Telephone Encounter (Signed)
New message    TOC appt made per Vernona Rieger with [facility] Carson on 10/30 at 12:15pm.

## 2017-10-07 NOTE — Telephone Encounter (Signed)
**Note De-identified Adam Willis Obfuscation** Pt has not been released from the hospital yet. Will call tomorrow. 

## 2017-10-07 NOTE — Progress Notes (Signed)
Physician notified: [place] At: [id number]  Regarding: Pt has no PIV access. OK to keep out? Starting coumadin therapy tonight.

## 2017-10-07 NOTE — Discharge Instructions (Addendum)
Atrial Fibrillation Atrial fibrillation is a type of irregular or rapid heartbeat (arrhythmia). In atrial fibrillation, the heart quivers continuously in a chaotic pattern. This occurs when parts of the heart receive disorganized signals that make the heart unable to pump blood normally. This can increase the risk for stroke, heart failure, and other heart-related conditions. There are different types of atrial fibrillation, including:  Paroxysmal atrial fibrillation. This type starts suddenly, and it usually stops on its own shortly after it starts.  Persistent atrial fibrillation. This type often lasts longer than a week. It may stop on its own or with treatment.  Long-lasting persistent atrial fibrillation. This type lasts longer than 12 months.  Permanent atrial fibrillation. This type does not go away.  Talk with your health care provider to learn about the type of atrial fibrillation that you have. What are the causes? This condition is caused by some heart-related conditions or procedures, including:  A heart attack.  Coronary artery disease.  Heart failure.  Heart valve conditions.  High blood pressure.  Inflammation of the sac that surrounds the heart (pericarditis).  Heart surgery.  Certain heart rhythm disorders, such as Wolf-Parkinson-White syndrome.  Other causes include:  Pneumonia.  Obstructive sleep apnea.  Blockage of an artery in the lungs (pulmonary embolism, or PE).  Lung cancer.  Chronic lung disease.  Thyroid problems, especially if the thyroid is overactive (hyperthyroidism).  Caffeine.  Excessive alcohol use or illegal drug use.  Use of some medicines, including certain decongestants and diet pills.  Sometimes, the cause cannot be found. What increases the risk? This condition is more likely to develop in:  People who are older in age.  People who smoke.  People who have diabetes mellitus.  People who are overweight  (obese).  Athletes who exercise vigorously.  What are the signs or symptoms? Symptoms of this condition include:  A feeling that your heart is beating rapidly or irregularly.  A feeling of discomfort or pain in your chest.  Shortness of breath.  Sudden light-headedness or weakness.  Getting tired easily during exercise.  In some cases, there are no symptoms. How is this diagnosed? Your health care provider may be able to detect atrial fibrillation when taking your pulse. If detected, this condition may be diagnosed with:  An electrocardiogram (ECG).  A Holter monitor test that records your heartbeat patterns over a 24-hour period.  Transthoracic echocardiogram (TTE) to evaluate how blood flows through your heart.  Transesophageal echocardiogram (TEE) to view more detailed images of your heart.  A stress test.  Imaging tests, such as a CT scan or chest X-ray.  Blood tests.  How is this treated? The main goals of treatment are to prevent blood clots from forming and to keep your heart beating at a normal rate and rhythm. The type of treatment that you receive depends on many factors, such as your underlying medical conditions and how you feel when you are experiencing atrial fibrillation. This condition may be treated with:  Medicine to slow down the heart rate, bring the hearts rhythm back to normal, or prevent clots from forming.  Electrical cardioversion. This is a procedure that resets your hearts rhythm by delivering a controlled, low-energy shock to the heart through your skin.  Different types of ablation, such as catheter ablation, catheter ablation with pacemaker, or surgical ablation. These procedures destroy the heart tissues that send abnormal signals. When the pacemaker is used, it is placed under your skin to help your heart beat in  a regular rhythm.  Follow these instructions at home:  Take over-the counter and prescription medicines only as told by your  health care provider.  If your health care provider prescribed a blood-thinning medicine (anticoagulant), take it exactly as told. Taking too much blood-thinning medicine can cause bleeding. If you do not take enough blood-thinning medicine, you will not have the protection that you need against stroke and other problems.  Do not use tobacco products, including cigarettes, chewing tobacco, and e-cigarettes. If you need help quitting, ask your health care provider.  If you have obstructive sleep apnea, manage your condition as told by your health care provider.  Do not drink alcohol.  Do not drink beverages that contain caffeine, such as coffee, soda, and tea.  Maintain a healthy weight. Do not use diet pills unless your health care provider approves. Diet pills may make heart problems worse.  Follow diet instructions as told by your health care provider.  Exercise regularly as told by your health care provider.  Keep all follow-up visits as told by your health care provider. This is important. How is this prevented?  Avoid drinking beverages that contain caffeine or alcohol.  Avoid certain medicines, especially medicines that are used for breathing problems.  Avoid certain herbs and herbal medicines, such as those that contain ephedra or ginseng.  Do not use illegal drugs, such as cocaine and amphetamines.  Do not smoke.  Manage your high blood pressure. Contact a health care provider if:  You notice a change in the rate, rhythm, or strength of your heartbeat.  You are taking an anticoagulant and you notice increased bruising.  You tire more easily when you exercise or exert yourself. Get help right away if:  You have chest pain, abdominal pain, sweating, or weakness.  You feel nauseous.  You notice blood in your vomit, bowel movement, or urine.  You have shortness of breath.  You suddenly have swollen feet and ankles.  You feel dizzy.  You have sudden weakness or  numbness of the face, arm, or leg, especially on one side of the body.  You have trouble speaking, trouble understanding, or both (aphasia).  Your face or your eyelid droops on one side. These symptoms may represent a serious problem that is an emergency. Do not wait to see if the symptoms will go away. Get medical help right away. Call your local emergency services (911 in the [place] Do not drive yourself to the hospital. This information is not intended to replace advice given to you by your health care provider. Make sure you discuss any questions you have with your health care provider. Document Released: 12/02/2005 Document Revised: 04/10/2016 Document Reviewed: 03/29/2015 Elsevier Interactive Patient Education  2017 [employer]. Information on my medicine - Coumadin   (Warfarin)  This medication education was reviewed with me or my healthcare representative as part of my discharge preparation.  The pharmacist that spoke with me during my hospital stay was:  Clarnce Flock, [facility]  Why was Coumadin prescribed for you? Coumadin was prescribed for you because you have a blood clot or a medical condition that can cause an increased risk of forming blood clots. Blood clots can cause serious health problems by blocking the flow of blood to the heart, lung, or brain. Coumadin can prevent harmful blood clots from forming. As a reminder your indication for Coumadin is:   Stroke Prevention Because Of Atrial Fibrillation  What test will check on my response to Coumadin? While on  Coumadin (warfarin) you will need to have an INR test regularly to ensure that your dose is keeping you in the desired range. The INR (international normalized ratio) number is calculated from the result of the laboratory test called prothrombin time (PT).  If an INR APPOINTMENT HAS NOT ALREADY BEEN MADE FOR YOU please schedule an appointment to have this lab work done by your health care provider within 7 days. Your INR  goal is usually a number between:  2 to 3 or your provider may give you a more narrow range like 2-2.5.  Ask your health care provider during an office visit what your goal INR is.  What  do you need to  know  About  COUMADIN? Take Coumadin (warfarin) exactly as prescribed by your healthcare provider about the same time each day.  DO NOT stop taking without talking to the doctor who prescribed the medication.  Stopping without other blood clot prevention medication to take the place of Coumadin may increase your risk of developing a new clot or stroke.  Get refills before you run out.  What do you do if you miss a dose? If you miss a dose, take it as soon as you remember on the same day then continue your regularly scheduled regimen the next day.  Do not take two doses of Coumadin at the same time.  Important Safety Information A possible side effect of Coumadin (Warfarin) is an increased risk of bleeding. You should call your healthcare provider right away if you experience any of the following: ? Bleeding from an injury or your nose that does not stop. ? Unusual colored urine (red or dark brown) or unusual colored stools (red or black). ? Unusual bruising for unknown reasons. ? A serious fall or if you hit your head (even if there is no bleeding).  Some foods or medicines interact with Coumadin (warfarin) and might alter your response to warfarin. To help avoid this: ? Eat a balanced diet, maintaining a consistent amount of Vitamin K. ? Notify your provider about major diet changes you plan to make. ? Avoid alcohol or limit your intake to 1 drink for women and 2 drinks for men per day. (1 drink is 5 oz. wine, 12 oz. beer, or 1.5 oz. liquor.)  Make sure that ANY health care provider who prescribes medication for you knows that you are taking Coumadin (warfarin).  Also make sure the healthcare provider who is monitoring your Coumadin knows when you have started a new medication including  herbals and non-prescription products.  Coumadin (Warfarin)  Major Drug Interactions  Increased Warfarin Effect Decreased Warfarin Effect  Alcohol (large quantities) Antibiotics (esp. Septra/Bactrim, Flagyl, Cipro) Amiodarone (Cordarone) Aspirin (ASA) Cimetidine (Tagamet) Megestrol (Megace) NSAIDs (ibuprofen, naproxen, etc.) Piroxicam (Feldene) Propafenone (Rythmol SR) Propranolol (Inderal) Isoniazid (INH) Posaconazole (Noxafil) Barbiturates (Phenobarbital) Carbamazepine (Tegretol) Chlordiazepoxide (Librium) Cholestyramine (Questran) Griseofulvin Oral Contraceptives Rifampin Sucralfate (Carafate) Vitamin K   Coumadin (Warfarin) Major Herbal Interactions  Increased Warfarin Effect Decreased Warfarin Effect  Garlic Ginseng Ginkgo biloba Coenzyme Q10 Green tea St. Johns wort    Coumadin (Warfarin) FOOD Interactions  Eat a consistent number of servings per week of foods HIGH in Vitamin K (1 serving =  cup)  Collards (cooked, or boiled & drained) Kale (cooked, or boiled & drained) Mustard greens (cooked, or boiled & drained) Parsley *serving size only =  cup Spinach (cooked, or boiled & drained) Swiss chard (cooked, or boiled & drained) Turnip greens (cooked, or boiled & drained)  Eat a consistent number of servings per week of foods MEDIUM-HIGH in Vitamin K (1 serving = 1 cup)  Asparagus (cooked, or boiled & drained) Broccoli (cooked, boiled & drained, or raw & chopped) Brussel sprouts (cooked, or boiled & drained) *serving size only =  cup Lettuce, raw (green leaf, endive, romaine) Spinach, raw Turnip greens, raw & chopped   These websites have more information on Coumadin (warfarin):  http://www.king-russell.com/www.coumadin.com; https://www.hines.net/www.ahrq.gov/consumer/coumadin.htm;

## 2017-10-07 NOTE — Progress Notes (Signed)
Progress Note  Patient Name: Adam Willis Quraishi Date of Encounter: 10/07/2017  Primary Cardiologist: Dr. Mayford Knifeurner  Subjective   No CP  No SOB    Inpatient Medications    Scheduled Meds: . apixaban  5 mg Oral BID  . aspirin EC  81 mg Oral Daily  . diltiazem  60 mg Oral Q12H  . hydrALAZINE  25 mg Oral Q8H  . sodium chloride flush  3 mL Intravenous Q12H   Continuous Infusions: . sodium chloride Stopped (10/07/17 0130)  . sodium chloride     PRN Meds: sodium chloride, acetaminophen **OR** acetaminophen, hydrALAZINE, ondansetron **OR** ondansetron (ZOFRAN) IV, sodium chloride flush   Vital Signs    Vitals:   10/06/17 1744 10/06/17 1813 10/06/17 2051 10/07/17 0600  BP: 138/77 (!) 184/85 (!) 146/87 112/87  Pulse: 79 70 85 63  Resp: 18 (!) 24 (!) 23 19  Temp:   98.4 F (36.9 C) 98 F (36.7 C)  TempSrc:   Oral Oral  SpO2: 97% 98% 98% 97%  Weight:    218 lb 14.4 oz (99.3 kg)  Height:        Intake/Output Summary (Last 24 hours) at 10/07/17 0913 Last data filed at 10/07/17 0741  Gross per 24 hour  Intake          2301.67 ml  Output             1175 ml  Net          1126.67 ml   Filed Weights   10/05/17 0318 10/06/17 0410 10/07/17 0600  Weight: 214 lb 3.2 oz (97.2 kg) 213 lb 14.4 oz (97 kg) 218 lb 14.4 oz (99.3 kg)    Telemetry    Atrial fib  80s  - Personally Reviewed  ECG      Physical Exam   GEN: No acute distress.   Neck: No JVD Cardiac: Irreg irreg , no murmurs, rubs, or gallops.  Respiratory: Clear to auscultation bilaterally. GI: Soft, nontender, non-distended  MS: No edema; No deformity. Neuro:  Nonfocal  Psych: Normal affect   Labs    Chemistry Recent Labs Lab 10/06/17 0835 10/06/17 1228 10/07/17 0428  NA 135 137 135  K 4.0 4.7 4.2  CL 104 105 103  CO2 26 26 25   GLUCOSE 103* 98 123*  BUN 17 15 13   CREATININE 1.41* 1.39* 1.43*  CALCIUM 8.5* 8.7* 8.5*  GFRNONAA 48* 48* 47*  GFRAA 55* 56* 54*  ANIONGAP 5 6 7       Hematology Recent Labs Lab 10/05/17 0525 10/06/17 0336 10/07/17 0428  WBC 5.3 4.6 4.6  RBC 4.68 4.68 4.33  HGB 13.0 13.1 11.9*  HCT 39.8 40.0 37.1*  MCV 85.0 85.5 85.7  MCH 27.8 28.0 27.5  MCHC 32.7 32.8 32.1  RDW 14.7 14.7 14.7  PLT 273 256 233    Cardiac Enzymes Recent Labs Lab 10/03/17 0115 10/03/17 0536 10/03/17 1124 10/03/17 1756  TROPONINI 0.16* 0.17* 0.20* 0.19*   No results for input(s): TROPIPOC in the last 168 hours.   BNP Recent Labs Lab 10/03/17 0115  BNP 614.9*     DDimer No results for input(s): DDIMER in the last 168 hours.   Radiology    Dg Chest Port 1v Same Day  Result Date: 10/05/2017 CLINICAL DATA:  Atrial fibrillation. EXAM: PORTABLE CHEST 1 VIEW COMPARISON:  10/03/2017. FINDINGS: The heart remains enlarged. Interstitial prominence represent pulmonary edema is improved compared with priors. No consolidation, effusion, or pneumothorax. Thoracic atherosclerosis. IMPRESSION: Interstitial  prominence representing pulmonary edema is improved compared with priors. No change cardiomegaly. Electronically Signed   By: Elsie Stain M.D.   On: 10/05/2017 09:59    Cardiac Studies   Cardiac cath rt and Lt 10-30-2017 Procedures   RIGHT/LEFT HEART CATH AND CORONARY ANGIOGRAPHY  Conclusion   1. Mildly elevated right heart filling pressure, normal left heart filling pressure.   2. Mild pulmonary hypertension, PVR not significantly elevated.  3. No significant coronary disease. '  More elevated PA pressure on initial echo may have been due to acute effects of cocaine use.     Echo 10/03/17 Study Conclusions  - Left ventricle: The cavity size was normal. Wall thickness was   increased in a pattern of moderate LVH. Systolic function was   normal. The estimated ejection fraction was in the range of 50%   to 55%. Wall motion was normal; there were no regional wall   motion abnormalities. - Mitral valve: There was mild regurgitation. - Left  atrium: The atrium was moderately dilated. - Right ventricle: The cavity size was mildly dilated. Systolic   function was mildly reduced. - Right atrium: The atrium was mildly dilated. - Pulmonary arteries: Systolic pressure was moderately to severely   increased. PA peak pressure: 67 mm Hg (S). - Pericardium, extracardiac: A trivial pericardial effusion was   identified.  Impressions:  - Normal LV systolic function; moderate LVH; mild MR; biatrial   enlargement; mild RVE with mildly reduced function; mild to   moderate TR with moderate to severe pulmonary hypertension.   Patient Profile     76 y.o. male with a history of hypertension, tobacco use, and heart murmur, presenting with progressive dyspnea on exertion, hypertensive urgency, and newly documented atrial fibrillation with mildly abnormal troponin I levels.  Assessment & Plan    New -Persistent atrial fibrillation with CHADSVASC score of 3.-cannot afford noac - add coumadin.   --on oral dilt 60 every 12 hours --HR overall OK    mild pulmonary hypertension by echocardiogram  PASP 67 mmHg on Echo and with cath only mild PH (41) - maybe echo with higher PASP due to recent cocaine at time of Echo.  Mild troponin elevation with no significant CAD at cath, yesterday   HTN urgency BP is better  Follow as outpt    Substance abuse, tobacco abuse  Counselled on cessation  Prediabetic A1c of 6  For questions or updates, please contact [facility] Please consult [email or web address] for contact info under Cardiology/STEMI.      Signed, Nada Boozer, NP  10/07/2017, 9:13 AM     Pt seen and examined  I have filled in physical exam above as well as assessment and plan. OK to d/c with outpt f/u.  Dietrich Pates

## 2017-10-08 DIAGNOSIS — F1410 Cocaine abuse, uncomplicated: Secondary | ICD-10-CM | POA: Diagnosis present

## 2017-10-08 DIAGNOSIS — Z7901 Long term (current) use of anticoagulants: Secondary | ICD-10-CM

## 2017-10-08 DIAGNOSIS — Z0389 Encounter for observation for other suspected diseases and conditions ruled out: Secondary | ICD-10-CM

## 2017-10-08 DIAGNOSIS — IMO0001 Reserved for inherently not codable concepts without codable children: Secondary | ICD-10-CM

## 2017-10-08 DIAGNOSIS — I272 Pulmonary hypertension, unspecified: Secondary | ICD-10-CM | POA: Diagnosis present

## 2017-10-08 DIAGNOSIS — N289 Disorder of kidney and ureter, unspecified: Secondary | ICD-10-CM

## 2017-10-08 HISTORY — DX: Reserved for inherently not codable concepts without codable children: IMO0001

## 2017-10-08 HISTORY — DX: Cocaine abuse, uncomplicated: F14.10

## 2017-10-08 HISTORY — DX: Pulmonary hypertension, unspecified: I27.20

## 2017-10-08 HISTORY — DX: Long term (current) use of anticoagulants: Z79.01

## 2017-10-08 LAB — CBC
HCT: 36.6 % — ABNORMAL LOW (ref 39.0–52.0)
Hemoglobin: 12.1 g/dL — ABNORMAL LOW (ref 13.0–17.0)
MCH: 28.2 pg (ref 26.0–34.0)
MCHC: 33.1 g/dL (ref 30.0–36.0)
MCV: 85.3 fL (ref 78.0–100.0)
Platelets: 211 K/uL (ref 150–400)
RBC: 4.29 MIL/uL (ref 4.22–5.81)
RDW: 14.6 % (ref 11.5–15.5)
WBC: 4 K/uL (ref 4.0–10.5)

## 2017-10-08 LAB — BASIC METABOLIC PANEL WITH GFR
Anion gap: 10 (ref 5–15)
BUN: 13 mg/dL (ref 6–20)
CO2: 23 mmol/L (ref 22–32)
Calcium: 8.6 mg/dL — ABNORMAL LOW (ref 8.9–10.3)
Chloride: 104 mmol/L (ref 101–111)
Creatinine, Ser: 1.26 mg/dL — ABNORMAL HIGH (ref 0.61–1.24)
GFR calc Af Amer: >60 mL/min
GFR calc non Af Amer: 54 mL/min — ABNORMAL LOW
Glucose, Bld: 122 mg/dL — ABNORMAL HIGH (ref 65–99)
Potassium: 3.8 mmol/L (ref 3.5–5.1)
Sodium: 137 mmol/L (ref 135–145)

## 2017-10-08 LAB — PROTIME-INR
INR: 1.22
Prothrombin Time: 15.3 s — ABNORMAL HIGH (ref 11.4–15.2)

## 2017-10-08 LAB — MAGNESIUM: Magnesium: 1.9 mg/dL (ref 1.7–2.4)

## 2017-10-08 MED ORDER — HYDRALAZINE HCL 25 MG PO TABS: 25.0000 mg | ORAL_TABLET | Freq: Three times a day (TID) | ORAL | 0 refills | Status: DC

## 2017-10-08 MED ORDER — WARFARIN SODIUM 5 MG PO TABS: 5.0000 mg | ORAL_TABLET | Freq: Once | ORAL | 0 refills | Status: DC

## 2017-10-08 MED ORDER — WARFARIN SODIUM 5 MG PO TABS: 5.0000 mg | ORAL_TABLET | Freq: Once | ORAL | Status: DC

## 2017-10-08 MED ORDER — DILTIAZEM HCL ER COATED BEADS 240 MG PO CP24: 240.0000 mg | ORAL_CAPSULE | Freq: Every day | ORAL | 0 refills | Status: DC

## 2017-10-08 NOTE — Care Management Important Message (Signed)
Important Message  Patient Details  Name: Rizwan Mammone MRN: [id number] Date of Birth: 07/26/1941   Medicare Important Message Given:  Yes    Elliot Cousin, RN 10/08/2017, 11:52 AM

## 2017-10-08 NOTE — Telephone Encounter (Signed)
I s/w the pt and he is still in the hospital at this time and is expected to be released today. He is advised to expect a call from [place] once he is released and at home. He verbalized understanding.

## 2017-10-08 NOTE — Discharge Summary (Signed)
Physician Discharge Summary  Adam CedarKenneth Delgreco NWG:[id number] DOB: [birth year] DOA: 10/03/2017  PCP: [facility]  Admit date: 10/03/2017 Discharge date: 10/08/2017  Admitted From: Home Disposition:  Home  Recommendations for Outpatient Follow-up:  1. Follow up with PCP in 1 week with CBC/BMP 2. Follow-up with Coumadin clinic at earliest convenience with repeat INR 3. Follow-up with cardiology in 1-2 weeks' time 4. Comply with medications and follow-up 5. Abstain from illicit drug use including cocaine  Home Health: No  Equipment/Devices: None  Discharge Condition: Stable  CODE STATUS: Full  Diet recommendation: Heart Healthy    Brief/Interim Summary: 76 year old male with history of hypertension, medical noncompliance, tobacco abuse, cocaine abuse presented with shortness of breath and he was found to have A. fib with RVR along with significantly elevated blood pressure. He was initially started on Cardizem drip. Cardiology was consulted. Patient had cardiac catheterization on 10/06/2017 which showed no significant coronary disease. His heart rate improved and his blood pressure has improved. He is currently on oral Cardizem and Coumadin has already been started. Cardiology has cleared the patient for discharge with outpatient follow-up with cardiology and Coumadin clinic. His urine drug screen was tested positive for cocaine and he was counseled about abstinence from illicit drug use including cocaine.  Discharge Diagnoses:  Principal Problem:   Atrial fibrillation with RVR (HCC) Active Problems:   Acute on chronic combined systolic and diastolic CHF (congestive heart failure) (HCC)   Acute renal insufficiency   Normocytic normochromic anemia   Tobacco abuse   Hypertensive urgency   New onset a-fib (HCC)   Cocaine abuse (HCC)   Pulmonary HTN (HCC)   Normal coronary arteries   Chronic anticoagulation  New onset A. fib with RVR - Initially started on Cardizem drip  which has been switched to oral Cardizem. Heart rate controlled. - Chadsvasc score is 3 - TSH within normal limits - Echo showed LVEF 50-55% with moderately dilated left atrium, severe pulmonary hypertension with right ventricular dysfunction.  PASP estimated at 67 mmHg - Cardiac catheterization on 10/06/17 showed no significant coronary disease. -Currently on Coumadin and oral Cardizem. Cardiology has cleared the patient for discharge with outpatient follow-up in Coumadin clinic at earliest convenience with repeat INR check and cardiology clinic. Continue Cardizem 240 mg daily  Pulmonary hypertension Echo report as above - Outpatient follow-up. Abstain from cocaine  Mild troponin elevation - Possible demand ischemia. Cardiac cath showed no significant coronary disease. Outpatient cardiology follow-up  Hypertensive urgency -Controlled - Discharge on hydralazine, Cardizem  AK I - Improving. Outpatient BMP follow-up. Discontinue lisinopril  Prediabetes A1c 6 PCP to follow-up on management Advised lifestyle modification  Substance and tobacco abuse Urine drug screen positive for cocaine Patient advised to quit   Discharge Instructions  Discharge Instructions    Amb referral to AFIB Clinic    Complete by:  As directed    Call MD for:  difficulty breathing, headache or visual disturbances    Complete by:  As directed    Call MD for:  extreme fatigue    Complete by:  As directed    Call MD for:  hives    Complete by:  As directed    Call MD for:  persistant dizziness or light-headedness    Complete by:  As directed    Call MD for:  persistant nausea and vomiting    Complete by:  As directed    Call MD for:  severe uncontrolled pain    Complete by:  As directed  Call MD for:  temperature >100.4    Complete by:  As directed    Diet - low sodium heart healthy    Complete by:  As directed    Discharge instructions    Complete by:  As directed    Comply with  medications and follow-up Abstain from illicit drugs including cocaine   Increase activity slowly    Complete by:  As directed      Allergies as of 10/08/2017   No Known Allergies     Medication List    STOP taking these medications   amLODipine 5 MG tablet Commonly known as:  NORVASC   aspirin 325 MG tablet   carvedilol 6.25 MG tablet Commonly known as:  COREG   lisinopril 20 MG tablet Commonly known as:  PRINIVIL,ZESTRIL     TAKE these medications   atorvastatin 20 MG tablet Commonly known as:  LIPITOR Take 20 mg by mouth daily.   diltiazem 240 MG 24 hr capsule Commonly known as:  CARDIZEM CD Take 1 capsule (240 mg total) by mouth daily.   famotidine 10 MG chewable tablet Commonly known as:  PEPCID AC Chew 10 mg by mouth 2 (two) times daily.   hydrALAZINE 25 MG tablet Commonly known as:  APRESOLINE Take 1 tablet (25 mg total) by mouth every 8 (eight) hours.   warfarin 5 MG tablet Commonly known as:  COUMADIN Take 1 tablet (5 mg total) by mouth one time only at 6 PM.      Follow-up Information    Quintella Reichert, MD Follow up on 10/14/2017.   Specialty:  Cardiology Why:  at 12:15 pm with her PA, Herma Carson Contact information: [address] [place] [address] [phone]        [facility] [address] Follow up on 10/10/2017.   Specialty:  Cardiology Why:  coumadin clinic at 10:30 AM  very important  Contact information: [address] [place] [address] [phone]       [facility]. Schedule an appointment as soon as possible for a visit in 1 week(s).   Why:  with repeat CBC/BMP Contact information: [address] [place] [address] [phone]          No Known Allergies  Consultations:  Cardiology   Procedures/Studies: Ct Angio Chest Pe W Or Wo Contrast  Result Date: 10/04/2017 CLINICAL DATA:  Shortness of breath for the past 2 days.  No pain.  EXAM: CT ANGIOGRAPHY CHEST WITH CONTRAST TECHNIQUE: Multidetector CT imaging of the chest was performed using the standard protocol during bolus administration of intravenous contrast. Multiplanar CT image reconstructions and MIPs were obtained to evaluate the vascular anatomy. CONTRAST:  80 mL Isovue 370 COMPARISON:  None. FINDINGS: Cardiovascular: Satisfactory opacification of the pulmonary arteries to the segmental level. No evidence of pulmonary embolism. Stable cardiomegaly. No pericardial effusion. Normal caliber thoracic aorta. No thoracic aortic aneurysm. Mediastinum/Nodes: No enlarged mediastinal, hilar, or axillary lymph nodes. Thyroid gland, trachea, and esophagus demonstrate no significant findings. Lungs/Pleura: Small right pleural effusion. Bibasilar dependent atelectasis. Bilateral centrilobular emphysema in the upper lobes. No pneumothorax. Upper Abdomen: No acute abnormality. Musculoskeletal: No chest wall abnormality. No acute or significant osseous findings. Review of the MIP images confirms the above findings. IMPRESSION: 1. No evidence of pulmonary embolus. 2. Small right pleural effusion.  Stable cardiomegaly. Electronically Signed   By: Elige Ko   On: 10/04/2017 13:28   Dg Chest Port 1  View  Result Date: 10/03/2017 CLINICAL DATA:  76 y/o M; history fibrillation, shortness of breath, dry cough, chest congestion. EXAM: PORTABLE CHEST 1 VIEW COMPARISON:  None. FINDINGS: Enlarged cardiac silhouette. Transcutaneous pacing pads noted. Reticular markings and peripheral lines compatible with interstitial pulmonary edema. No consolidation, effusion, or pneumothorax identified. Bones are unremarkable. IMPRESSION: Enlarged cardiac silhouette.  Interstitial pulmonary edema. Electronically Signed   By: Mitzi Hansen M.D.   On: 10/03/2017 01:28   Dg Chest Port 1v Same Day  Result Date: 10/05/2017 CLINICAL DATA:  Atrial fibrillation. EXAM: PORTABLE CHEST 1 VIEW COMPARISON:   10/03/2017. FINDINGS: The heart remains enlarged. Interstitial prominence represent pulmonary edema is improved compared with priors. No consolidation, effusion, or pneumothorax. Thoracic atherosclerosis. IMPRESSION: Interstitial prominence representing pulmonary edema is improved compared with priors. No change cardiomegaly. Electronically Signed   By: Elsie Stain M.D.   On: 10/05/2017 09:59    Echo 10/03/17 Study Conclusions  - Left ventricle: The cavity size was normal. Wall thickness was increased in a pattern of moderate LVH. Systolic function was normal. The estimated ejection fraction was in the range of 50% to 55%. Wall motion was normal; there were no regional wall motion abnormalities. - Mitral valve: There was mild regurgitation. - Left atrium: The atrium was moderately dilated. - Right ventricle: The cavity size was mildly dilated. Systolic function was mildly reduced. - Right atrium: The atrium was mildly dilated. - Pulmonary arteries: Systolic pressure was moderately to severely increased. PA peak pressure: 67 mm Hg (S). - Pericardium, extracardiac: A trivial pericardial effusion was identified.  Impressions:  - Normal LV systolic function; moderate LVH; mild MR; biatrial enlargement; mild RVE with mildly reduced function; mild to moderate TR with moderate to severe pulmonary hypertension.  Cardiac cath rt and Lt 10/06/17 Procedures   RIGHT/LEFT HEART CATH AND CORONARY ANGIOGRAPHY  Conclusion   1. Mildly elevated right heart filling pressure, normal left heart filling pressure.  2. Mild pulmonary hypertension, PVR not significantly elevated.  3. No significant coronary disease. '  More elevated PA pressure on initial echo may have been due to acute effects of cocaine use.     Subjective: Patient seen and examined at bedside. He feels much better and wants to go home. No overnight fever, nausea or vomiting.  Discharge Exam: Vitals:    10/07/17 2249 10/08/17 0600  BP: 130/85 (!) 144/73  Pulse: 72 (!) 55  Resp:    Temp: 97.8 F (36.6 C) 99.3 F (37.4 C)  SpO2: 99% 97%   Vitals:   10/07/17 1300 10/07/17 2249 10/08/17 0550 10/08/17 0600  BP:  130/85  (!) 144/73  Pulse:  72  (!) 55  Resp: 20     Temp: 98.5 F (36.9 C) 97.8 F (36.6 C)  99.3 F (37.4 C)  TempSrc: Oral Oral  Oral  SpO2: 96% 99%  97%  Weight:   98.8 kg (217 lb 14.4 oz)   Height:        General: Pt is alert, awake, not in acute distress Cardiovascular: Intermittent bradycardia, S1/S2 + Respiratory: Bilateral decreased breath sounds at bases Abdominal: Soft, NT, ND, bowel sounds + Extremities: no edema, no cyanosis    The results of significant diagnostics from this hospitalization (including imaging, microbiology, ancillary and laboratory) are listed below for reference.     Microbiology: No results found for this or any previous visit (from the past 240 hour(s)).   Labs: BNP (last 3 results)  Recent Labs  10/03/17 0115  BNP 614.9*  Basic Metabolic Panel:  Recent Labs Lab 10/03/17 0115  10/06/17 0336 10/06/17 0835 10/06/17 1228 10/07/17 0428 10/08/17 0451  NA 141  < > 134* 135 137 135 137  K 3.2*  < > 3.8 4.0 4.7 4.2 3.8  CL 106  < > 102 104 105 103 104  CO2 25  < > 26 26 26 25 23   GLUCOSE 137*  < > 101* 103* 98 123* 122*  BUN 15  < > 17 17 15 13 13   CREATININE 1.49*  < > 1.49* 1.41* 1.39* 1.43* 1.26*  CALCIUM 8.9  < > 8.6* 8.5* 8.7* 8.5* 8.6*  MG 1.7  --  1.8  --   --  1.9 1.9  < > = values in this interval not displayed. Liver Function Tests:  Recent Labs Lab 10/07/17 1143  AST 28  ALT 42  ALKPHOS 110  BILITOT 1.1  PROT 7.3  ALBUMIN 3.6   No results for input(s): LIPASE, AMYLASE in the last 168 hours. No results for input(s): AMMONIA in the last 168 hours. CBC:  Recent Labs Lab 10/04/17 0344 10/05/17 0525 10/06/17 0336 10/07/17 0428 10/08/17 0451  WBC 4.8 5.3 4.6 4.6 4.0  HGB 11.8* 13.0 13.1  11.9* 12.1*  HCT 36.4* 39.8 40.0 37.1* 36.6*  MCV 84.8 85.0 85.5 85.7 85.3  PLT 232 273 256 233 211   Cardiac Enzymes:  Recent Labs Lab 10/03/17 0115 10/03/17 0536 10/03/17 1124 10/03/17 1756  TROPONINI 0.16* 0.17* 0.20* 0.19*   BNP: Invalid input(s): POCBNP CBG: No results for input(s): GLUCAP in the last 168 hours. D-Dimer No results for input(s): DDIMER in the last 72 hours. Hgb A1c No results for input(s): HGBA1C in the last 72 hours. Lipid Profile No results for input(s): CHOL, HDL, LDLCALC, TRIG, CHOLHDL, LDLDIRECT in the last 72 hours. Thyroid function studies No results for input(s): TSH, T4TOTAL, T3FREE, THYROIDAB in the last 72 hours.  Invalid input(s): FREET3 Anemia work up No results for input(s): VITAMINB12, FOLATE, FERRITIN, TIBC, IRON, RETICCTPCT in the last 72 hours. Urinalysis    Component Value Date/Time   COLORURINE COLORLESS (A) 10/03/2017 2153   APPEARANCEUR CLEAR 10/03/2017 2153   LABSPEC 1.005 10/03/2017 2153   PHURINE 6.0 10/03/2017 2153   GLUCOSEU NEGATIVE 10/03/2017 2153   HGBUR NEGATIVE 10/03/2017 2153   BILIRUBINUR NEGATIVE 10/03/2017 2153   KETONESUR NEGATIVE 10/03/2017 2153   PROTEINUR NEGATIVE 10/03/2017 2153   NITRITE NEGATIVE 10/03/2017 2153   LEUKOCYTESUR NEGATIVE 10/03/2017 2153   Sepsis Labs Invalid input(s): PROCALCITONIN,  WBC,  LACTICIDVEN Microbiology No results found for this or any previous visit (from the past 240 hour(s)).   Time coordinating discharge: 35 minutes  SIGNED:   Glade Lloyd, MD  [facility] 10/08/2017, 10:50 AM Pager: [phone]  If 7PM-7AM, please contact night-coverage [email or web address] Password [email or web address]

## 2017-10-08 NOTE — Care Management Note (Signed)
Case Management Note  Patient Details  Name: Adam Willis MRN: [id number] Date of Birth: 1941/04/02  Subjective/Objective:   Afib, hypotension                 Action/Plan: Discharge Planning: please see previous NCM notes. Pt will dc home on Coumadin. Lives at home with wife. Adam Willis. Independent pta. Pt requested dc summary faxed to office. He sees Dr Hanley Hays in Clinic.   PCP [facility]  Expected Discharge Date:  10/08/17               Expected Discharge Plan:  Home/Self Care  In-House Referral:  NA  Discharge planning Services  CM Consult, Medication Assistance  Post Acute Care Choice:  NA Choice offered to:  NA  DME Arranged:  N/A DME Agency:  NA  HH Arranged:  NA HH Agency:  NA  Status of Service:  Completed, signed off  If discussed at Long Length of Stay Meetings, dates discussed:    Additional Comments:  Elliot Cousin, RN 10/08/2017, 11:49 AM

## 2017-10-08 NOTE — Progress Notes (Signed)
ANTICOAGULATION CONSULT NOTE - Follow Up Consult  Pharmacy Consult for Coumadin Indication: atrial fibrillation  No Known Allergies  Patient Measurements: Height: 6\' 3"  (190.5 cm) Weight: 217 lb 14.4 oz (98.8 kg) IBW/kg (Calculated) : 84.5  Vital Signs: Temp: 99.3 F (37.4 C) (10/24 0600) Temp Source: Oral (10/24 0600) BP: 144/73 (10/24 0600) Pulse Rate: 55 (10/24 0600)  Labs:  Recent Labs  10/06/17 0336  10/06/17 1228 10/07/17 0428 10/07/17 1143 10/08/17 0451  HGB 13.1  --   --  11.9*  --  12.1*  HCT 40.0  --   --  37.1*  --  36.6*  PLT 256  --   --  233  --  211  LABPROT 14.8  --   --   --  14.3 15.3*  INR 1.17  --   --   --  1.12 1.22  HEPARINUNFRC 0.81*  --  0.56  --   --   --   CREATININE 1.49*  < > 1.39* 1.43*  --  1.26*  < > = values in this interval not displayed.  Estimated Creatinine Clearance: 61.5 mL/min (A) (by C-G formula based on SCr of 1.26 mg/dL (H)).  Assessment:  Anticoag: heparin for AFib (CHADsVASc 3). Presumed no anticoagulation PTA (hasn't taken for 2 weeks even if he was on something). Heparin>>Coumadin (no bridge per cardiology) INR 1.12>1.22 today. CBC stable overnight  >>Pt has no medicare part D: DOAC full cost.  Goal of Therapy:  INR 2-3 Monitor platelets by anticoagulation protocol: Yes   Plan:  Repeat Warfarin 5 mg x1 Daily INR, CBC as need Emphasize compliance   Dachelle Molzahn S. Merilynn Finland, PharmD, [facility] Clinical Staff Pharmacist Pager [phone]  Misty Stanley Stillinger 10/08/2017,8:45 AM

## 2017-10-08 NOTE — Consult Note (Signed)
Progress Note  Patient Name: Adam Willis Date of Encounter: 10/08/2017  Primary Cardiologist: Dr Mayford Knifeurner  Subjective   No chest pain or SOB  Inpatient Medications    Scheduled Meds: . diltiazem  240 mg Oral Daily  . hydrALAZINE  25 mg Oral Q8H  . sodium chloride flush  3 mL Intravenous Q12H  . warfarin  5 mg Oral ONCE-1800  . warfarin   Does not apply Once  . Warfarin - Pharmacist Dosing Inpatient   Does not apply q1800   Continuous Infusions: . sodium chloride 100 mL/hr at 10/07/17 2102  . sodium chloride     PRN Meds: sodium chloride, acetaminophen **OR** acetaminophen, ondansetron **OR** ondansetron (ZOFRAN) IV, sodium chloride flush   Vital Signs    Vitals:   10/07/17 1300 10/07/17 2249 10/08/17 0550 10/08/17 0600  BP:  130/85  (!) 144/73  Pulse:  72  (!) 55  Resp: 20     Temp: 98.5 F (36.9 C) 97.8 F (36.6 C)  99.3 F (37.4 C)  TempSrc: Oral Oral  Oral  SpO2: 96% 99%  97%  Weight:   217 lb 14.4 oz (98.8 kg)   Height:        Intake/Output Summary (Last 24 hours) at 10/08/17 0859 Last data filed at 10/08/17 0700  Gross per 24 hour  Intake              720 ml  Output             1450 ml  Net             -730 ml   Filed Weights   10/06/17 0410 10/07/17 0600 10/08/17 0550  Weight: 213 lb 14.4 oz (97 kg) 218 lb 14.4 oz (99.3 kg) 217 lb 14.4 oz (98.8 kg)    Telemetry    AF with CVR - Personally Reviewed  ECG     AF with CVR, poor anterior RW, anterior TWI- Personally Reviewed  Physical Exam   GEN: No acute distress.   Neck: No JVD Cardiac: RRR, no murmurs, rubs, or gallops.  Respiratory: Clear to auscultation bilaterally. GI: Soft, nontender, non-distended  MS: No edema; No deformity. Neuro:  Nonfocal  Psych: Normal affect   Labs    Chemistry Recent Labs Lab 10/06/17 1228 10/07/17 0428 10/07/17 1143 10/08/17 0451  NA 137 135  --  137  K 4.7 4.2  --  3.8  CL 105 103  --  104  CO2 26 25  --  23  GLUCOSE 98 123*  --  122*    BUN 15 13  --  13  CREATININE 1.39* 1.43*  --  1.26*  CALCIUM 8.7* 8.5*  --  8.6*  PROT  --   --  7.3  --   ALBUMIN  --   --  3.6  --   AST  --   --  28  --   ALT  --   --  42  --   ALKPHOS  --   --  110  --   BILITOT  --   --  1.1  --   GFRNONAA 48* 47*  --  54*  GFRAA 56* 54*  --  >60  ANIONGAP 6 7  --  10     Hematology Recent Labs Lab 10/06/17 0336 10/07/17 0428 10/08/17 0451  WBC 4.6 4.6 4.0  RBC 4.68 4.33 4.29  HGB 13.1 11.9* 12.1*  HCT 40.0 37.1* 36.6*  MCV 85.5 85.7  85.3  MCH 28.0 27.5 28.2  MCHC 32.8 32.1 33.1  RDW 14.7 14.7 14.6  PLT 256 233 211    Cardiac Enzymes Recent Labs Lab 10/03/17 0115 10/03/17 0536 10/03/17 1124 10/03/17 1756  TROPONINI 0.16* 0.17* 0.20* 0.19*   No results for input(s): TROPIPOC in the last 168 hours.   BNP Recent Labs Lab 10/03/17 0115  BNP 614.9*     DDimer No results for input(s): DDIMER in the last 168 hours.   Radiology    PCXR- 10/05/17- IMPRESSION: Interstitial prominence representing pulmonary edema is improved compared with priors. No change cardiomegaly.   Cardiac Studies   Echo 10/03/17  Impressions:  - Normal LV systolic function; moderate LVH; mild MR; biatrial   enlargement; mild RVE with mildly reduced function; mild to   moderate TR with moderate to severe pulmonary hypertension.  Cath 10/06/17 1. Mildly elevated right heart filling pressure, normal left heart filling pressure.   2. Mild pulmonary hypertension, PVR not significantly elevated.  3. No significant coronary disease. '  More elevated PA pressure on initial echo may have been due to acute effects of cocaine use.   Patient Profile     76 y.o. male, followed at [facility] with a history of HTN and a "heart murmur" but no prior cardiac history, admitted 10/03/17 with DOE, PND, and tachycardia with exertion. In the ERD he was noted to be in AF with RVR, uncontrolled HTN, moderate renal insufficiency, and CHF. He was also  cocaine positive. He is a CHADs VASc=3 but has no insurance and cannot afford a NOAC so Coumadin initiated. He diuresed 7 lbs (I/O inaccurate) with improvement. Rt and Lt cath done 10/07/17 showed normal coronaries and mild pulmonary HTN.   Assessment & Plan    New -Persistent atrial fibrillation   CHADSVASC score of 3.-cannot afford noac - on coumadin.   --on Diltiazem 240 --HR overall OK    mild pulmonary hypertension  by echocardiogram  PASP 67 mmHg on Echo and with cath only mild PH (41) - maybe echo with higher PASP due to recent cocaine at time of Echo.  Mild troponin elevation  with no significant CAD at cath  HTN urgency BP is better  Follow as outpt   Not on ACE secondary to renal insufficiency  Substance abuse, tobacco abuse  Counselled on cessation Not on beta blocker secondary to cocaine use  Prediabetic A1c of 6  Renal insufficiency F/u BMP as an OP  Plan- OK to DC on current medications. We will arrange f/u INR and office visit.   Corine Shelter PA-C 10/08/2017 9:23 AM    For questions or updates, please contact [facility] Please consult [email or web address] for contact info under Cardiology/STEMI.      Signed, Corine Shelter, PA-C  10/08/2017, 8:59 AM    Pt seen and examined  I agree with findings as noted by L Kilroy Pt comfortable   Lungs CTA  Cardiac Irreg Irreg  No S3  Ext without edema  Plan for rate control for now and coumadin   Cath with no signif CAD  MInimally elevated pressures BP is OK Counselled on cocaine and tobacco cessation.  OK to d/c from a cardiac standpoint.  Dietrich Pates

## 2017-10-09 NOTE — Telephone Encounter (Signed)
Patient contacted on 10/09/2017 at 9:51am Patient understands to follow up with  Provider on:  10/10/2017 at 10:30a coumadin clinic as a new patient 10/14/2017 at 12:15pm with Jacolyn Reedy  Patient understands discharge instructions? yes Patient understands medications and regimen? yes Patient understands to bring all medications to this visit? Yes  Spoke with patient at length and informed him of appointments, address of office and how he is feeling. He states he is feeling well.

## 2017-10-10 ENCOUNTER — Ambulatory Visit (INDEPENDENT_AMBULATORY_CARE_PROVIDER_SITE_OTHER): Payer: Medicare Other

## 2017-10-10 DIAGNOSIS — I4891 Unspecified atrial fibrillation: Secondary | ICD-10-CM

## 2017-10-10 DIAGNOSIS — Z7901 Long term (current) use of anticoagulants: Secondary | ICD-10-CM | POA: Diagnosis not present

## 2017-10-10 LAB — POCT INR: INR: 1.2

## 2017-10-10 NOTE — Patient Instructions (Signed)

## 2017-10-14 ENCOUNTER — Ambulatory Visit (INDEPENDENT_AMBULATORY_CARE_PROVIDER_SITE_OTHER): Payer: Medicare Other | Admitting: Pharmacist

## 2017-10-14 ENCOUNTER — Encounter (INDEPENDENT_AMBULATORY_CARE_PROVIDER_SITE_OTHER): Payer: Self-pay

## 2017-10-14 ENCOUNTER — Encounter: Payer: Self-pay | Admitting: Physician Assistant

## 2017-10-14 ENCOUNTER — Ambulatory Visit (INDEPENDENT_AMBULATORY_CARE_PROVIDER_SITE_OTHER): Payer: Medicare Other | Admitting: Physician Assistant

## 2017-10-14 VITALS — BP 128/84 | HR 78 | Ht 75.0 in | Wt 221.8 lb

## 2017-10-14 DIAGNOSIS — I5042 Chronic combined systolic (congestive) and diastolic (congestive) heart failure: Secondary | ICD-10-CM

## 2017-10-14 DIAGNOSIS — I4891 Unspecified atrial fibrillation: Secondary | ICD-10-CM | POA: Diagnosis not present

## 2017-10-14 DIAGNOSIS — I272 Pulmonary hypertension, unspecified: Secondary | ICD-10-CM | POA: Diagnosis not present

## 2017-10-14 DIAGNOSIS — Z720 Tobacco use: Secondary | ICD-10-CM

## 2017-10-14 DIAGNOSIS — N189 Chronic kidney disease, unspecified: Secondary | ICD-10-CM

## 2017-10-14 DIAGNOSIS — Z7901 Long term (current) use of anticoagulants: Secondary | ICD-10-CM | POA: Diagnosis not present

## 2017-10-14 DIAGNOSIS — F1410 Cocaine abuse, uncomplicated: Secondary | ICD-10-CM

## 2017-10-14 LAB — BASIC METABOLIC PANEL
BUN / CREAT RATIO: 16 (ref 10–24)
BUN: 22 mg/dL (ref 8–27)
CHLORIDE: 103 mmol/L (ref 96–106)
CO2: 19 mmol/L — ABNORMAL LOW (ref 20–29)
Calcium: 9.3 mg/dL (ref 8.6–10.2)
Creatinine, Ser: 1.39 mg/dL — ABNORMAL HIGH (ref 0.76–1.27)
GFR calc non Af Amer: 50 mL/min/{1.73_m2} — ABNORMAL LOW (ref >59)
GFR, EST AFRICAN AMERICAN: 57 mL/min/{1.73_m2} — AB (ref >59)
GLUCOSE: 111 mg/dL — AB (ref 65–99)
POTASSIUM: 4.8 mmol/L (ref 3.5–5.2)
SODIUM: 139 mmol/L (ref 134–144)

## 2017-10-14 LAB — POCT INR: INR: 1.5

## 2017-10-14 MED ORDER — ATORVASTATIN CALCIUM 20 MG PO TABS: 20.0000 mg | ORAL_TABLET | Freq: Every day | ORAL | 11 refills | Status: DC

## 2017-10-14 NOTE — Addendum Note (Signed)
Addended by: Tonita Phoenix on: 10/14/2017 12:22 PM   Modules accepted: Orders

## 2017-10-14 NOTE — Progress Notes (Signed)
Cardiology Office Note    Date:  10/14/2017   ID:  Adam NissenKenneth Willis, DOB Mar 17, 1941, MRN [id number]  PCP:  Adam Willis, Adam T, MD  Cardiologist: Dr. Mayford Willis  Chief Complaint  Patient presents with  . Follow-up    History of Present Illness:  Adam Willis is a 76 y.o. male  [facility] with history of hypertension who was admitted to [facility] 10/03/17 with atrial fibrillation with RVR, uncontrolled hypertension, CHF, renal insufficiency.  He was positive for cocaine.  Chads BASC equals 3 but he has no insurance and cannot afford NOAC so Coumadin initiated.  Right and left heart catheterization 10/07/17 showed normal coronary arteries and mild pulmonary hypertension.  Normal LV systolic function moderate LVH with mild MR and moderate to severe pulmonary hypertension on 2D echo 10/03/17.  He was not placed on ACE inhibitor because of renal insufficiency and no beta-blocker secondary to cocaine use.-Charge creatinine 1.26.  Patient comes in today accompanied by his fiance.  Overall he is done well.  He denies any chest pain, palpitations, dyspnea, dyspnea on exertion, or edema.  He is not using any drugs and has not smoked any cigarettes.  He did get a little dizzy while he was standing and shaving this morning but that was before he ate anything.  No dizziness when he first gets up or walks around and this was one episode.    Past Medical History:  Diagnosis Date  . Heart murmur   . Hypertension     Past Surgical History:  Procedure Laterality Date  . RIGHT/LEFT HEART CATH AND CORONARY ANGIOGRAPHY N/A 10/06/2017   Procedure: RIGHT/LEFT HEART CATH AND CORONARY ANGIOGRAPHY;  Surgeon: Laurey MoraleMcLean, Dalton S, MD;  [facility];  Service: Cardiovascular;  Laterality: N/A;    Current Medications: Current Meds  Medication Sig  . atorvastatin (LIPITOR) 20 MG tablet Take 1 tablet (20 mg total) by mouth daily.  Marland Kitchen. diltiazem (CARDIZEM CD) 240 MG 24 hr capsule  Take 1 capsule (240 mg total) by mouth daily.  . famotidine (PEPCID AC) 10 MG chewable tablet Chew 10 mg by mouth 2 (two) times daily.  . hydrALAZINE (APRESOLINE) 25 MG tablet Take 1 tablet (25 mg total) by mouth every 8 (eight) hours.  Marland Kitchen. warfarin (COUMADIN) 5 MG tablet Take 1 tablet (5 mg total) by mouth one time only at 6 PM.  . [DISCONTINUED] atorvastatin (LIPITOR) 20 MG tablet Take 20 mg by mouth daily.     Allergies:   Patient has no known allergies.   Social History   Social History  . Marital status: Single    Spouse name: N/A  . Number of children: N/A  . Years of education: N/A   Occupational History  . 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 Smoking status: Current Every Day Smoker    Types: Cigarettes  . Smokeless tobacco: Never Used     Comment: cut back to 1-2/day in 2016  . Alcohol use No  . Drug use: No  . Sexual activity: Not Asked   Other Topics Concern  . None   Social History Narrative   [place] He has a fiance.     Family History:  The patient's   family history includes Alzheimer's disease (age of onset: 7686) in his mother; CAD (age of onset: 2370) in his father; Hypertension in his brother.   ROS:   Please see the history  of present illness.    Review of Systems  Constitution: Negative.  HENT: Negative.   Cardiovascular: Negative.   Respiratory: Negative.   Endocrine: Negative.   Hematologic/Lymphatic: Negative.   Musculoskeletal: Negative.   Gastrointestinal: Negative.   Genitourinary: Negative.   Neurological: Positive for dizziness.   All other systems reviewed and are negative.   PHYSICAL EXAM:   VS:  BP 128/84   Pulse 78   Ht 6\' 3"  (1.905 m)   Wt 221 lb 12.8 oz (100.6 kg)   SpO2 97%   BMI 27.72 kg/m   Physical Exam  GEN: Well nourished, well developed, in no acute distress  Neck: no JVD, carotid bruits, or masses Cardiac:RRR; 2/6 to 3/6 systolic murmur lower left sternal  border and apex Respiratory:  clear to auscultation bilaterally, normal work of breathing GI: soft, nontender, nondistended, + BS Ext: Right arm cath site without hematoma or hemorrhage, good radial brachial pulses, lower extremities without cyanosis, clubbing, or edema, Good distal pulses bilaterally Neuro:  Alert and Oriented x 3 Psych: euthymic mood, full affect  Wt Readings from Last 3 Encounters:  10/14/17 221 lb 12.8 oz (100.6 kg)  10/08/17 217 lb 14.4 oz (98.8 kg)      Studies/Labs Reviewed:   EKG:  EKG is not ordered today.  Recent Labs: 10/03/2017: B Natriuretic Peptide 614.9; TSH 1.275 10/07/2017: ALT 42 10/08/2017: BUN 13; Creatinine, Ser 1.26; Hemoglobin 12.1; Magnesium 1.9; Platelets 211; Potassium 3.8; Sodium 137   Lipid Panel No results found for: CHOL, TRIG, HDL, CHOLHDL, VLDL, LDLCALC, LDLDIRECT  Additional studies/ records that were reviewed today include:   Echo 10/03/17   Impressions:   - Normal LV systolic function; moderate LVH; mild MR; biatrial   enlargement; mild RVE with mildly reduced function; mild to   moderate TR with moderate to severe pulmonary hypertension.   Cath 10/06/17 1. Mildly elevated right heart filling pressure, normal left heart filling pressure.   2. Mild pulmonary hypertension, PVR not significantly elevated.  3. No significant coronary disease. '   More elevated PA pressure on initial echo may have been due to acute effects of cocaine use.     ASSESSMENT:    1. Chronic combined systolic and diastolic CHF (congestive heart failure) (HCC)   2. Chronic renal impairment, unspecified CKD stage   3. Atrial fibrillation with RVR (HCC)   4. Pulmonary HTN (HCC)   5. Tobacco abuse   6. Cocaine abuse (HCC)      PLAN:  In order of problems listed above:  Chronic combined systolic and diastolic CHF currently well compensated without evidence of heart failure on exam.  Continue hydralazine.  Patient did have an episode of  dizziness but it was only once and not with change of position.  If he continues to have dizziness we may need to decrease hydralazine.  Follow-up with Dr. Mayford Knife next available.  Chronic renal insufficiency creatinine 1.26 at discharge.  Will repeat labs today.  No ACE inhibitor because of this.  Atrial fibrillation with controlled rate on diltiazem and Coumadin.  Follow-up with Dr. Mayford Knife to decide if cardioversion is an option for him.  Pulmonary hypertension blood pressure is now well controlled on hydralazine and diltiazem  Tobacco abuse patient has not smoked since hospitalization  Cocaine abuse was positive in the hospital but has not used since he has been home.  No beta-blocker because of this.    Medication Adjustments/Labs and Tests Ordered: Current medicines are reviewed at length with the patient  today.  Concerns regarding medicines are outlined above.  Medication changes, Labs and Tests ordered today are listed in the Patient Instructions below. Patient Instructions  Your physician recommends that you continue on your current medications as directed. Please refer to the Current Medication list given to you today.  Your physician recommends that you return for lab work in: TODAY  BMET     Your physician recommends that you schedule a follow-up appointment in:  NEXT AVAILABLE WITH DR Shirl Harris, Jacolyn Reedy, PA-C  10/14/2017 12:16 PM    [facility] [address] [place], [place], [place]  [address] Phone: [phone]; Fax: [phone]

## 2017-10-14 NOTE — Patient Instructions (Signed)
Your physician recommends that you continue on your current medications as directed. Please refer to the Current Medication list given to you today.   Your physician recommends that you return for lab work in:  TODAY BMET   Your physician recommends that you schedule a follow-up appointment in: NEXT AVAILABLE WITH DR TURNER   

## 2017-10-14 NOTE — Addendum Note (Signed)
Addended by: Dyann Kief on: 10/14/2017 01:04 PM   Modules accepted: Level of Service

## 2017-10-23 ENCOUNTER — Ambulatory Visit (INDEPENDENT_AMBULATORY_CARE_PROVIDER_SITE_OTHER): Payer: Medicare Other | Admitting: *Deleted

## 2017-10-23 DIAGNOSIS — Z5181 Encounter for therapeutic drug level monitoring: Secondary | ICD-10-CM | POA: Diagnosis not present

## 2017-10-23 DIAGNOSIS — Z7901 Long term (current) use of anticoagulants: Secondary | ICD-10-CM

## 2017-10-23 DIAGNOSIS — I4891 Unspecified atrial fibrillation: Secondary | ICD-10-CM

## 2017-10-23 LAB — POCT INR: INR: 4.4

## 2017-10-31 ENCOUNTER — Ambulatory Visit (INDEPENDENT_AMBULATORY_CARE_PROVIDER_SITE_OTHER): Payer: Medicare Other | Admitting: *Deleted

## 2017-10-31 DIAGNOSIS — I4891 Unspecified atrial fibrillation: Secondary | ICD-10-CM | POA: Diagnosis not present

## 2017-10-31 DIAGNOSIS — Z7901 Long term (current) use of anticoagulants: Secondary | ICD-10-CM | POA: Diagnosis not present

## 2017-10-31 LAB — POCT INR: INR: 3.4

## 2017-10-31 NOTE — Patient Instructions (Signed)
Hold coumadin dose to day then change dose to 1 tablet daily.  Recheck INR in 1 week.

## 2017-11-05 ENCOUNTER — Telehealth: Payer: Self-pay | Admitting: Cardiology

## 2017-11-05 ENCOUNTER — Other Ambulatory Visit: Payer: Self-pay | Admitting: *Deleted

## 2017-11-05 MED ORDER — DILTIAZEM HCL ER COATED BEADS 240 MG PO CP24
240.0000 mg | ORAL_CAPSULE | Freq: Every day | ORAL | 9 refills | Status: DC
Start: 2017-11-05 — End: 2018-09-08

## 2017-11-05 MED ORDER — HYDRALAZINE HCL 25 MG PO TABS: 25.0000 mg | ORAL_TABLET | Freq: Three times a day (TID) | ORAL | 9 refills | Status: DC

## 2017-11-05 NOTE — Telephone Encounter (Signed)
°*  STAT* If patient is at the pharmacy, call can be transferred to refill team.   1. Which medications need to be refilled? (please list name of each medication and dose if known) Diltiazem,Hydralazine and Warfarin   2. Which pharmacy/location (including street and city if local pharmacy) is medication to be sent to?[facility][phone]  3. Do they need a 30 day or 90 day supply? 30 and refills

## 2017-11-05 NOTE — Telephone Encounter (Signed)
I refilled diltiazem and hydralazine. Please address request for warfarin. Thanks, MI

## 2017-11-10 ENCOUNTER — Other Ambulatory Visit: Payer: Self-pay | Admitting: *Deleted

## 2017-11-10 MED ORDER — WARFARIN SODIUM 5 MG PO TABS: 5.0000 mg | ORAL_TABLET | ORAL | 1 refills | Status: DC

## 2017-11-17 ENCOUNTER — Ambulatory Visit (INDEPENDENT_AMBULATORY_CARE_PROVIDER_SITE_OTHER): Payer: Medicare Other | Admitting: *Deleted

## 2017-11-17 DIAGNOSIS — I4891 Unspecified atrial fibrillation: Secondary | ICD-10-CM | POA: Diagnosis not present

## 2017-11-17 DIAGNOSIS — Z7901 Long term (current) use of anticoagulants: Secondary | ICD-10-CM | POA: Diagnosis not present

## 2017-11-17 LAB — POCT INR: INR: 3.4

## 2017-11-17 NOTE — Patient Instructions (Signed)
Skip today's dose, then change your dose to 1 tablet daily except 1/2 tablet on Sundays and Thursdays. Recheck INR in 10 days. Keep leafy green intake consistent.

## 2017-11-25 DIAGNOSIS — M1000 Idiopathic gout, unspecified site: Secondary | ICD-10-CM | POA: Diagnosis not present

## 2017-11-25 DIAGNOSIS — M25572 Pain in left ankle and joints of left foot: Secondary | ICD-10-CM | POA: Diagnosis not present

## 2017-11-27 ENCOUNTER — Ambulatory Visit (INDEPENDENT_AMBULATORY_CARE_PROVIDER_SITE_OTHER): Payer: Medicare Other | Admitting: Pharmacist

## 2017-11-27 DIAGNOSIS — I4891 Unspecified atrial fibrillation: Secondary | ICD-10-CM | POA: Diagnosis not present

## 2017-11-27 DIAGNOSIS — Z7901 Long term (current) use of anticoagulants: Secondary | ICD-10-CM

## 2017-11-27 LAB — POCT INR: INR: 3.3

## 2017-11-27 NOTE — Patient Instructions (Signed)
Description   Skip today's dose, take only 1/2 tablet on Friday then continue 1 tablet daily except 1/2 tablet on Sundays and Thursdays. Recheck INR in 6 days due to daughter's surgery. Keep leafy green intake consistent.

## 2017-12-02 ENCOUNTER — Ambulatory Visit (INDEPENDENT_AMBULATORY_CARE_PROVIDER_SITE_OTHER): Payer: Medicare Other | Admitting: Pharmacist

## 2017-12-02 DIAGNOSIS — Z7901 Long term (current) use of anticoagulants: Secondary | ICD-10-CM | POA: Diagnosis not present

## 2017-12-02 DIAGNOSIS — I4891 Unspecified atrial fibrillation: Secondary | ICD-10-CM | POA: Diagnosis not present

## 2017-12-02 LAB — POCT INR: INR: 3.1

## 2017-12-02 NOTE — Patient Instructions (Signed)
Description   Skip today's dose, then continue 1 tablet daily except 1/2 tablet on Sundays and Thursdays. Recheck INR in 2 weeks. Keep leafy green intake consistent.

## 2017-12-17 ENCOUNTER — Ambulatory Visit (INDEPENDENT_AMBULATORY_CARE_PROVIDER_SITE_OTHER): Payer: Medicare Other | Admitting: Pharmacist

## 2017-12-17 ENCOUNTER — Encounter (INDEPENDENT_AMBULATORY_CARE_PROVIDER_SITE_OTHER): Payer: Self-pay

## 2017-12-17 DIAGNOSIS — I4891 Unspecified atrial fibrillation: Secondary | ICD-10-CM

## 2017-12-17 DIAGNOSIS — Z7901 Long term (current) use of anticoagulants: Secondary | ICD-10-CM | POA: Diagnosis not present

## 2017-12-17 LAB — POCT INR: INR: 2.3

## 2017-12-17 NOTE — Patient Instructions (Signed)
Description   Continue 1 tablet daily except 1/2 tablet on Sundays and Thursdays. Recheck INR in 3 weeks. Keep leafy green intake consistent.

## 2017-12-31 DIAGNOSIS — I10 Essential (primary) hypertension: Secondary | ICD-10-CM

## 2017-12-31 HISTORY — DX: Essential (primary) hypertension: I10

## 2017-12-31 NOTE — Progress Notes (Signed)
Cardiology Office Note:    Date:  01/01/2018   ID:  Adam Willis, DOB [birth year], MRN [id number]  PCP:  Sherrill Raring, MD  Cardiologist:  No primary care provider on file.    Referring MD: Sherrill Raring, MD   Chief Complaint  Patient presents with  . Atrial Fibrillation  . Hypertension    History of Present Illness:    Adam Willis is a 77 y.o. male with a hx of persistent atrial fibrillation with a CHADS2VASC score of 3 on chronic anticoagulation with coumadin (cannot afford NOAC - no insurance), mild pulmonary HTN, polysubstance abuse (cocaine), CKD and HTN.   He was dx with new onset atrial fibrillation and mild trop elevation.  2D echo showed low normal LVF with moderate to severe pulmonary HTN.  He underwent cardiac cath showing mild pulmonary HTN, no CAD and mildly elevated left heart pressures.  He was started on Coumadin and Cardizem for rate control.  He was seen back by Herma Carson, PA and was doing well.    He is here today for followup and is doing well.  He denies any chest pain or pressure, PND, orthopnea, LE edema, dizziness, palpitations or syncope. He does have some DOE with walking up hills.  He is compliant with his meds and is tolerating meds with no SE.    Past Medical History:  Diagnosis Date  . Heart murmur   . Hypertension     Past Surgical History:  Procedure Laterality Date  . RIGHT/LEFT HEART CATH AND CORONARY ANGIOGRAPHY N/A 10/06/2017   Procedure: RIGHT/LEFT HEART CATH AND CORONARY ANGIOGRAPHY;  Surgeon: Laurey Morale, MD;  Location: [facility] INVASIVE CV LAB;  Service: Cardiovascular;  Laterality: N/A;    Current Medications: Current Meds  Medication Sig  . atorvastatin (LIPITOR) 20 MG tablet Take 1 tablet (20 mg total) by mouth daily.  Marland Kitchen diltiazem (CARDIZEM CD) 240 MG 24 hr capsule Take 1 capsule (240 mg total) by mouth daily.  . famotidine (PEPCID AC) 10 MG chewable tablet Chew 10 mg by mouth 2 (two) times daily.  . hydrALAZINE  (APRESOLINE) 25 MG tablet Take 1 tablet (25 mg total) by mouth every 8 (eight) hours.  Marland Kitchen warfarin (COUMADIN) 5 MG tablet Take 1 tablet (5 mg total) by mouth as directed.     Allergies:   Patient has no known allergies.   Social History   Socioeconomic History  . Marital status: Single    Spouse name: None  . Number of children: None  . Years of education: None  . Highest education level: None  Social Needs  . Financial resource strain: None  . Food insecurity - worry: None  . Food insecurity - inability: None  . Transportation needs - medical: None  . Transportation needs - non-medical: None  Occupational History  . 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 Smoking status: Current Every Day Smoker    Types: Cigarettes  . Smokeless tobacco: Never Used  . Tobacco comment: cut back to 1-2/day in 2016  Substance and Sexual Activity  . Alcohol use: No  . Drug use: No  . Sexual activity: None  Other Topics Concern  . None  Social History Narrative   He lives in [place]. He has a fiance.     Family History: The patient's family history includes Alzheimer's disease (age of onset: 50) in his mother; CAD (age of onset: 4) in his father; Hypertension in his brother.  ROS:   Please see the history of present illness.    Review of Systems  Musculoskeletal: Positive for joint pain and muscle cramps.    All other systems reviewed and negative.   EKGs/Labs/Other Studies Reviewed:    The following studies were reviewed today: none  EKG:  EKG is  ordered today and shows atrial fibrillation with CVR at 71bpm with PVCs.    Recent Labs: 10/03/2017: B Natriuretic Peptide 614.9; TSH 1.275 10/07/2017: ALT 42 10/08/2017: Hemoglobin 12.1; Magnesium 1.9; Platelets 211 10/14/2017: BUN 22; Creatinine, Ser 1.39; Potassium 4.8; Sodium 139   Recent Lipid Panel No results found for: CHOL, TRIG, HDL, CHOLHDL, VLDL, LDLCALC, LDLDIRECT  Physical Exam:     VS:  BP (!) 142/87   Pulse 63   Ht 6\' 3"  (1.905 m)   Wt 235 lb 9.6 oz (106.9 kg)   BMI 29.45 kg/m     Wt Readings from Last 3 Encounters:  01/01/18 235 lb 9.6 oz (106.9 kg)  10/14/17 221 lb 12.8 oz (100.6 kg)  10/08/17 217 lb 14.4 oz (98.8 kg)     GEN:  Well nourished, well developed in no acute distress HEENT: Normal NECK: No JVD; No carotid bruits LYMPHATICS: No lymphadenopathy CARDIAC: RRR, no murmurs, rubs, gallops RESPIRATORY:  Clear to auscultation without rales, wheezing or rhonchi  ABDOMEN: Soft, non-tender, non-distended MUSCULOSKELETAL:  No edema; No deformity  SKIN: Warm and dry NEUROLOGIC:  Alert and oriented x 3 PSYCHIATRIC:  Normal affect   ASSESSMENT:    1. New onset a-fib (HCC)   2. Pulmonary HTN (HCC)   3. Benign essential HTN    PLAN:    In order of problems listed above:  1.  New onset atrial fibrillation - He remains in NSR with controlled HR.  He will continue on diltiazem CD 240mg  daily and warfarin.  His INR was 2.3 on 12/17/2017.  He has had a therapeutic INR since November so I will set him up for DCCV.    2.  Moderate to severe pulmonary HTN but mild by right heart cath.   3.  HTN - BP is well controlled on exam today. He will continue on Diltiazem CD 240mg  daily and Hydralazine 25mg  TID.     Medication Adjustments/Labs and Tests Ordered: Current medicines are reviewed at length with the patient today.  Concerns regarding medicines are outlined above.  No orders of the defined types were placed in this encounter.  No orders of the defined types were placed in this encounter.   Signed, Armanda Magic, MD  01/01/2018 10:11 AM    [facility]

## 2018-01-01 ENCOUNTER — Ambulatory Visit (INDEPENDENT_AMBULATORY_CARE_PROVIDER_SITE_OTHER): Payer: Medicare Other | Admitting: *Deleted

## 2018-01-01 ENCOUNTER — Encounter: Payer: Self-pay | Admitting: Cardiology

## 2018-01-01 ENCOUNTER — Ambulatory Visit (INDEPENDENT_AMBULATORY_CARE_PROVIDER_SITE_OTHER): Payer: Medicare Other | Admitting: Cardiology

## 2018-01-01 VITALS — BP 142/87 | HR 63 | Ht 75.0 in | Wt 235.6 lb

## 2018-01-01 DIAGNOSIS — I4891 Unspecified atrial fibrillation: Secondary | ICD-10-CM | POA: Diagnosis not present

## 2018-01-01 DIAGNOSIS — I272 Pulmonary hypertension, unspecified: Secondary | ICD-10-CM | POA: Diagnosis not present

## 2018-01-01 DIAGNOSIS — Z7901 Long term (current) use of anticoagulants: Secondary | ICD-10-CM | POA: Diagnosis not present

## 2018-01-01 DIAGNOSIS — I10 Essential (primary) hypertension: Secondary | ICD-10-CM | POA: Diagnosis not present

## 2018-01-01 LAB — POCT INR: INR: 3.2

## 2018-01-01 NOTE — Patient Instructions (Addendum)
Medication Instructions:  Your physician recommends that you continue on your current medications as directed. Please refer to the Current Medication list given to you today.  If you need a refill on your cardiac medications, please contact your pharmacy first.  Labwork: You are scheduled for BMET and INR on 01/05/18  Testing/Procedures:  Your physician has recommended that you have a Cardioversion (DCCV). Electrical Cardioversion uses a jolt of electricity to your heart either through paddles or wired patches attached to your chest. This is a controlled, usually prescheduled, procedure. Defibrillation is done under light anesthesia in the hospital, and you usually go home the day of the procedure. This is done to get your heart back into a normal rhythm. You are not awake for the procedure. Please see the instruction sheet given to you today.   Follow-Up: Please follow up with DCCV PA within 2 weeks after your cardioversion.   Your physician wants you to follow-up in: 1 year with Dr. Mayford Knife. You will receive a reminder letter in the mail two months in advance. If you don't receive a letter, please call our office to schedule the follow-up appointment.  Any Other Special Instructions Will Be Listed Below (If Applicable).     If you need a refill on your cardiac medications before your next appointment, please call your pharmacy.

## 2018-01-02 ENCOUNTER — Other Ambulatory Visit: Payer: Self-pay | Admitting: Cardiology

## 2018-01-05 ENCOUNTER — Other Ambulatory Visit: Payer: Medicare Other

## 2018-01-08 ENCOUNTER — Other Ambulatory Visit: Payer: Medicare Other

## 2018-01-08 ENCOUNTER — Ambulatory Visit (INDEPENDENT_AMBULATORY_CARE_PROVIDER_SITE_OTHER): Payer: Medicare Other | Admitting: *Deleted

## 2018-01-08 DIAGNOSIS — I4891 Unspecified atrial fibrillation: Secondary | ICD-10-CM | POA: Diagnosis not present

## 2018-01-08 DIAGNOSIS — Z7901 Long term (current) use of anticoagulants: Secondary | ICD-10-CM

## 2018-01-08 LAB — POCT INR: INR: 2.1

## 2018-01-08 NOTE — Patient Instructions (Signed)
Description   Continue taking 1 tablet daily except 1/2 tablet on Sundays and Thursdays. Recheck INR in 1 week. Coumadin Clinic [phone]  Main [phone]

## 2018-01-09 ENCOUNTER — Telehealth: Payer: Self-pay | Admitting: Pharmacist

## 2018-01-09 LAB — BASIC METABOLIC PANEL
BUN/Creatinine Ratio: 17 (ref 10–24)
BUN: 20 mg/dL (ref 8–27)
CALCIUM: 9 mg/dL (ref 8.6–10.2)
CO2: 20 mmol/L (ref 20–29)
Chloride: 105 mmol/L (ref 96–106)
Creatinine, Ser: 1.18 mg/dL (ref 0.76–1.27)
GFR calc Af Amer: 70 mL/min/{1.73_m2} (ref >59)
GFR calc non Af Amer: 60 mL/min/{1.73_m2} (ref >59)
GLUCOSE: 101 mg/dL — AB (ref 65–99)
POTASSIUM: 4.4 mmol/L (ref 3.5–5.2)
SODIUM: 141 mmol/L (ref 134–144)

## 2018-01-09 LAB — PROTIME-INR
INR: 1.9 — ABNORMAL HIGH (ref 0.8–1.2)
PROTHROMBIN TIME: 18.6 s — AB (ref 9.1–12.0)

## 2018-01-09 NOTE — Telephone Encounter (Signed)
LMOM to call back so that weekly INRs for DCCV can be set up per result notes on 01/08/17.

## 2018-01-12 ENCOUNTER — Ambulatory Visit (HOSPITAL_COMMUNITY): Admission: RE | Admit: 2018-01-12 | Payer: Medicare Other | Source: Ambulatory Visit | Admitting: Cardiovascular Disease

## 2018-01-12 ENCOUNTER — Encounter (HOSPITAL_COMMUNITY): Admission: RE | Payer: Self-pay | Source: Ambulatory Visit

## 2018-01-12 SURGERY — CARDIOVERSION
Anesthesia: General

## 2018-01-12 NOTE — Telephone Encounter (Signed)
INR check scheduled for tomorrow 01/13/18 to start weekly INRs after sub therapeutic INR on lab report.

## 2018-01-13 ENCOUNTER — Ambulatory Visit (INDEPENDENT_AMBULATORY_CARE_PROVIDER_SITE_OTHER): Payer: Medicare Other | Admitting: *Deleted

## 2018-01-13 DIAGNOSIS — Z5181 Encounter for therapeutic drug level monitoring: Secondary | ICD-10-CM | POA: Diagnosis not present

## 2018-01-13 DIAGNOSIS — I4891 Unspecified atrial fibrillation: Secondary | ICD-10-CM | POA: Diagnosis not present

## 2018-01-13 DIAGNOSIS — Z7901 Long term (current) use of anticoagulants: Secondary | ICD-10-CM | POA: Diagnosis not present

## 2018-01-13 LAB — POCT INR: INR: 1.8

## 2018-01-13 NOTE — Patient Instructions (Signed)
Description   Today take 1.5 tablets then change your dose to 1 tablet daily except 1/2 tablet on Thursdays. Recheck INR in 1 week pending DCCV. Coumadin Clinic [phone]  Main [phone]

## 2018-01-20 ENCOUNTER — Ambulatory Visit (INDEPENDENT_AMBULATORY_CARE_PROVIDER_SITE_OTHER): Payer: Medicare Other | Admitting: Pharmacist

## 2018-01-20 DIAGNOSIS — Z7901 Long term (current) use of anticoagulants: Secondary | ICD-10-CM

## 2018-01-20 DIAGNOSIS — I4891 Unspecified atrial fibrillation: Secondary | ICD-10-CM

## 2018-01-20 LAB — POCT INR: INR: 1.7

## 2018-01-20 NOTE — Patient Instructions (Signed)
Description   Today take 1.5 tablets then change your dose to 1 tablet daily. Recheck INR in 1 week pending DCCV. Coumadin Clinic [phone]  Main [phone]

## 2018-01-26 ENCOUNTER — Ambulatory Visit (INDEPENDENT_AMBULATORY_CARE_PROVIDER_SITE_OTHER): Payer: Medicare Other | Admitting: Cardiology

## 2018-01-26 ENCOUNTER — Encounter: Payer: Self-pay | Admitting: Cardiology

## 2018-01-26 VITALS — BP 142/86 | HR 61 | Resp 16 | Ht 76.0 in | Wt 236.0 lb

## 2018-01-26 DIAGNOSIS — I481 Persistent atrial fibrillation: Secondary | ICD-10-CM

## 2018-01-26 DIAGNOSIS — I4819 Other persistent atrial fibrillation: Secondary | ICD-10-CM

## 2018-01-26 NOTE — H&P (View-Only) (Signed)
01/26/2018 Adam Willis   [birth year]  [id number]  Primary Physician Sherrill Raring, MD Primary Cardiologist: Dr. Mayford Knife   Reason for Visit/CC:  Persistent Atrial Fibrillation   HPI:  Adam Willis is a 77 y.o. male with a hx of persistent atrial fibrillation with a CHADS2VASC score of 3 on chronic anticoagulation with coumadin (cannot afford NOAC - no insurance), mild pulmonary HTN, polysubstance abuse (cocaine), CKD and HTN.   He was dx with new onset atrial fibrillation and mild trop elevation.  2D echo showed low normal LVF with moderate to severe pulmonary HTN.  He underwent cardiac cath showing mild pulmonary HTN, no CAD and mildly elevated left heart pressures.  He was started on Coumadin and Cardizem for rate control. He was recently seen by Dr. Mayford Knife 12/22/17 and was still in atrial fibrillation and set up for DCCV. Cardioversion was arranged for 1/28, however, had to be canceled given failure to maintain therapeutic INRs x 3 consecutive weeks. This has been an ongoing issue. His INR last week was subtherapeutic at 1.7. He is due for repeat INR check tomorrow.    From a symptom standpoint, he has been asymptomatic. Still in persistent atrial fibrillation that is rate controlled in the mid 50s with Cardizem.   Current Meds  Medication Sig  . acetaminophen (TYLENOL) 650 MG CR tablet Take 1,300 mg by mouth every 8 (eight) hours as needed for pain.  Marland Kitchen atorvastatin (LIPITOR) 20 MG tablet Take 1 tablet (20 mg total) by mouth daily.  Marland Kitchen diltiazem (CARDIZEM CD) 240 MG 24 hr capsule Take 1 capsule (240 mg total) by mouth daily.  . famotidine (PEPCID AC) 10 MG chewable tablet Chew 10 mg by mouth 2 (two) times daily.  . hydrALAZINE (APRESOLINE) 25 MG tablet Take 1 tablet (25 mg total) by mouth every 8 (eight) hours.  . Menthol, Topical Analgesic, (ABSORBINE PAIN RELIEVING EX) Apply 1 patch topically daily as needed (for pain).  Marland Kitchen warfarin (COUMADIN) 5 MG tablet TAKE ONE TABLET BY MOUTH AS  DIRECTED (Patient taking differently: Take 2.5 mg by mouth daily on Thursday, Friday and Sunday. Take 5 mg by mouth daily on all other days)   No Known Allergies Past Medical History:  Diagnosis Date  . Heart murmur   . Hypertension    Family History  Problem Relation Age of Onset  . Hypertension Brother   . Alzheimer's disease Mother 88  . CAD Father 50   Past Surgical History:  Procedure Laterality Date  . RIGHT/LEFT HEART CATH AND CORONARY ANGIOGRAPHY N/A 10/06/2017   Procedure: RIGHT/LEFT HEART CATH AND CORONARY ANGIOGRAPHY;  Surgeon: Laurey Morale, MD;  Location: [facility] INVASIVE CV LAB;  Service: Cardiovascular;  Laterality: N/A;   Social History   Socioeconomic History  . Marital status: Single    Spouse name: Not on file  . Number of children: Not on file  . Years of education: Not on file  . Highest education level: Not on file  Social Needs  . Financial resource strain: Not on file  . Food insecurity - worry: Not on file  . Food insecurity - inability: Not on file  . Transportation needs - medical: Not on file  . Transportation needs - non-medical: Not on file  Occupational History  . 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 Smoking status: Former Smoker    Years: 10.00    Types: Cigarettes    Last attempt to quit: 10/05/2017    Years  since quitting: 0.3  . Smokeless tobacco: Never Used  . Tobacco comment: cut back to 1-2/day in 2016  Substance and Sexual Activity  . Alcohol use: No  . Drug use: Yes    Types: Other-see comments  . Sexual activity: Not on file  Other Topics Concern  . Not on file  Social History Narrative   He lives in [place]. He has a fiance.     Review of Systems: General: negative for chills, fever, night sweats or weight changes.  Cardiovascular: negative for chest pain, dyspnea on exertion, edema, orthopnea, palpitations, paroxysmal nocturnal dyspnea or shortness of breath Dermatological: negative  for rash Respiratory: negative for cough or wheezing Urologic: negative for hematuria Abdominal: negative for nausea, vomiting, diarrhea, bright red blood per rectum, melena, or hematemesis Neurologic: negative for visual changes, syncope, or dizziness All other systems reviewed and are otherwise negative except as noted above.   Physical Exam:  Blood pressure (!) 162/86, pulse 61, resp. rate 16, height 6\' 4"  (1.93 m), weight 236 lb (107 kg), SpO2 97 %.  General appearance: alert, cooperative and no distress Neck: no carotid bruit and no JVD Lungs: clear to auscultation bilaterally Heart: irregularly irregular rhythm, regular rate S1, S2 normal, no murmur, click, rub or gallop Extremities: extremities normal, atraumatic, no cyanosis or edema Pulses: 2+ and symmetric Skin: Skin color, texture, turgor normal. No rashes or lesions Neurologic: Grossly normal  EKG persistent atrial fibrillation 56 bpm -- personally reviewed   ASSESSMENT AND PLAN:   1. Persistent Atrial Fibrillation: he is well rate controlled with Cardizem and asymptomatic. Ultimate plan is for attempt at [facility], however he has had difficulties maintaining therapeutic INRs. INR last week was subtherapeutic at 1.7. He will need 3 consecutive weeks of therapeutic INRs prior to DCCV. Will recheck INR again this week and will hopefully be able to arrange in 3 weeks, if therapeutic. For now, continue Cardizem for rate control and avoid triggers that can exacerbate HR.   2. HTN: controlled on current regimen.   Follow-Up: hopeful DCCV in 3-4 weeks. Will follow INR weekly. F/u after cardioversion attempt.   [name], MHS [facility] 01/26/2018 9:45 AM

## 2018-01-26 NOTE — Patient Instructions (Addendum)
Medication Instructions:  Your physician recommends that you continue on your current medications as directed. Please refer to the Current Medication list given to you today.  Labwork: None ordered today  Testing/Procedures: None ordered today  Follow-Up: Your physician recommends that you keep your scheduled follow-up appointment on February 23, 2018 at 10:30 am with Boyce Medici PA-C  Any Other Special Instructions Will Be Listed Below (If Applicable).  If you need a refill on your cardiac medications before your next appointment, please call your pharmacy.

## 2018-01-26 NOTE — Progress Notes (Signed)
01/26/2018 [name]   [birth year]  [id number]  Primary Physician Sherrill Raring, MD Primary Cardiologist: Dr. Mayford Knife   Reason for Visit/CC:  Persistent Atrial Fibrillation   HPI:  Ralpheal Surgener is a 77 y.o. male with a hx of persistent atrial fibrillation with a CHADS2VASC score of 3 on chronic anticoagulation with coumadin (cannot afford NOAC - no insurance), mild pulmonary HTN, polysubstance abuse (cocaine), CKD and HTN.   He was dx with new onset atrial fibrillation and mild trop elevation.  2D echo showed low normal LVF with moderate to severe pulmonary HTN.  He underwent cardiac cath showing mild pulmonary HTN, no CAD and mildly elevated left heart pressures.  He was started on Coumadin and Cardizem for rate control. He was recently seen by Dr. Mayford Knife 12/22/17 and was still in atrial fibrillation and set up for DCCV. Cardioversion was arranged for 1/28, however, had to be canceled given failure to maintain therapeutic INRs x 3 consecutive weeks. This has been an ongoing issue. His INR last week was subtherapeutic at 1.7. He is due for repeat INR check tomorrow.    From a symptom standpoint, he has been asymptomatic. Still in persistent atrial fibrillation that is rate controlled in the mid 50s with Cardizem.   Current Meds  Medication Sig  . acetaminophen (TYLENOL) 650 MG CR tablet Take 1,300 mg by mouth every 8 (eight) hours as needed for pain.  Marland Kitchen atorvastatin (LIPITOR) 20 MG tablet Take 1 tablet (20 mg total) by mouth daily.  Marland Kitchen diltiazem (CARDIZEM CD) 240 MG 24 hr capsule Take 1 capsule (240 mg total) by mouth daily.  . famotidine (PEPCID AC) 10 MG chewable tablet Chew 10 mg by mouth 2 (two) times daily.  . hydrALAZINE (APRESOLINE) 25 MG tablet Take 1 tablet (25 mg total) by mouth every 8 (eight) hours.  . Menthol, Topical Analgesic, (ABSORBINE PAIN RELIEVING EX) Apply 1 patch topically daily as needed (for pain).  Marland Kitchen warfarin (COUMADIN) 5 MG tablet TAKE ONE TABLET BY MOUTH AS  DIRECTED (Patient taking differently: Take 2.5 mg by mouth daily on Thursday, Friday and Sunday. Take 5 mg by mouth daily on all other days)   No Known Allergies Past Medical History:  Diagnosis Date  . Heart murmur   . Hypertension    Family History  Problem Relation Age of Onset  . Hypertension Brother   . Alzheimer's disease Mother 88  . CAD Father 50   Past Surgical History:  Procedure Laterality Date  . RIGHT/LEFT HEART CATH AND CORONARY ANGIOGRAPHY N/A 10/06/2017   Procedure: RIGHT/LEFT HEART CATH AND CORONARY ANGIOGRAPHY;  Surgeon: Laurey Morale, MD;  Location: [facility] INVASIVE CV LAB;  Service: Cardiovascular;  Laterality: N/A;   Social History   Socioeconomic History  . Marital status: Single    Spouse name: Not on file  . Number of children: Not on file  . Years of education: Not on file  . Highest education level: Not on file  Social Needs  . Financial resource strain: Not on file  . Food insecurity - worry: Not on file  . Food insecurity - inability: Not on file  . Transportation needs - medical: Not on file  . Transportation needs - non-medical: Not on file  Occupational History  . 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 Smoking status: Former Smoker    Years: 10.00    Types: Cigarettes    Last attempt to quit: 10/05/2017    Years  since quitting: 0.3  . Smokeless tobacco: Never Used  . Tobacco comment: cut back to 1-2/day in 2016  Substance and Sexual Activity  . Alcohol use: No  . Drug use: Yes    Types: Other-see comments  . Sexual activity: Not on file  Other Topics Concern  . Not on file  Social History Narrative   He lives in [place]. He has a fiance.     Review of Systems: General: negative for chills, fever, night sweats or weight changes.  Cardiovascular: negative for chest pain, dyspnea on exertion, edema, orthopnea, palpitations, paroxysmal nocturnal dyspnea or shortness of breath Dermatological: negative  for rash Respiratory: negative for cough or wheezing Urologic: negative for hematuria Abdominal: negative for nausea, vomiting, diarrhea, bright red blood per rectum, melena, or hematemesis Neurologic: negative for visual changes, syncope, or dizziness All other systems reviewed and are otherwise negative except as noted above.   Physical Exam:  Blood pressure (!) 162/86, pulse 61, resp. rate 16, height 6\' 4"  (1.93 m), weight 236 lb (107 kg), SpO2 97 %.  General appearance: alert, cooperative and no distress Neck: no carotid bruit and no JVD Lungs: clear to auscultation bilaterally Heart: irregularly irregular rhythm, regular rate S1, S2 normal, no murmur, click, rub or gallop Extremities: extremities normal, atraumatic, no cyanosis or edema Pulses: 2+ and symmetric Skin: Skin color, texture, turgor normal. No rashes or lesions Neurologic: Grossly normal  EKG persistent atrial fibrillation 56 bpm -- personally reviewed   ASSESSMENT AND PLAN:   1. Persistent Atrial Fibrillation: he is well rate controlled with Cardizem and asymptomatic. Ultimate plan is for attempt at [facility], however he has had difficulties maintaining therapeutic INRs. INR last week was subtherapeutic at 1.7. He will need 3 consecutive weeks of therapeutic INRs prior to DCCV. Will recheck INR again this week and will hopefully be able to arrange in 3 weeks, if therapeutic. For now, continue Cardizem for rate control and avoid triggers that can exacerbate HR.   2. HTN: controlled on current regimen.   Follow-Up: hopeful DCCV in 3-4 weeks. Will follow INR weekly. F/u after cardioversion attempt.   Mahad Newstrom Delmer Islam, MHS [facility] 01/26/2018 9:45 AM

## 2018-01-27 ENCOUNTER — Ambulatory Visit (INDEPENDENT_AMBULATORY_CARE_PROVIDER_SITE_OTHER): Payer: Medicare Other

## 2018-01-27 DIAGNOSIS — I4891 Unspecified atrial fibrillation: Secondary | ICD-10-CM

## 2018-01-27 DIAGNOSIS — Z7901 Long term (current) use of anticoagulants: Secondary | ICD-10-CM

## 2018-01-27 LAB — POCT INR: INR: 3.5

## 2018-01-27 NOTE — Patient Instructions (Signed)
Description   Take 1/2 tablet today, then resume same dosage 1 tablet daily. Recheck INR in 1 week pending DCCV. Coumadin Clinic [phone]  Main [phone]

## 2018-02-03 ENCOUNTER — Ambulatory Visit (INDEPENDENT_AMBULATORY_CARE_PROVIDER_SITE_OTHER): Payer: Medicare Other | Admitting: *Deleted

## 2018-02-03 DIAGNOSIS — I4891 Unspecified atrial fibrillation: Secondary | ICD-10-CM

## 2018-02-03 DIAGNOSIS — Z7901 Long term (current) use of anticoagulants: Secondary | ICD-10-CM

## 2018-02-03 LAB — POCT INR: INR: 4.1

## 2018-02-03 NOTE — Patient Instructions (Signed)
Description   Hold today's dose then resume same dosage 1 tablet daily.  Recheck INR in 1 week pending DCCV. Coumadin Clinic [phone]  Main [phone]     

## 2018-02-06 ENCOUNTER — Telehealth: Payer: Self-pay | Admitting: Cardiology

## 2018-02-11 ENCOUNTER — Ambulatory Visit (INDEPENDENT_AMBULATORY_CARE_PROVIDER_SITE_OTHER): Payer: Medicare Other | Admitting: Pharmacist

## 2018-02-11 DIAGNOSIS — Z7901 Long term (current) use of anticoagulants: Secondary | ICD-10-CM | POA: Diagnosis not present

## 2018-02-11 DIAGNOSIS — I4891 Unspecified atrial fibrillation: Secondary | ICD-10-CM

## 2018-02-11 LAB — POCT INR: INR: 4

## 2018-02-11 NOTE — Patient Instructions (Signed)
Description   Hold today's dose then resume same dosage 1 tablet daily.  Recheck INR in 1 week pending DCCV. Coumadin Clinic [phone]  Main [phone]

## 2018-02-13 ENCOUNTER — Telehealth: Payer: Self-pay

## 2018-02-13 DIAGNOSIS — Z79899 Other long term (current) drug therapy: Secondary | ICD-10-CM

## 2018-02-13 NOTE — Telephone Encounter (Signed)
Patient scheduled for DCCV on 02/19/18 at 9am with Dr. Shirlee Latch, patient will come in for BMET on 02/17/18. Patient in agreement with plan, verbalized understanding and thankful for the call. Cardioversion instruction placed up front for pick up on 02/17/18.

## 2018-02-13 NOTE — Telephone Encounter (Signed)
Case number for DCCV is [id number]

## 2018-02-13 NOTE — Telephone Encounter (Signed)
-----   Message from Allayne Butcher, [place] sent at 02/12/2018  8:11 PM EST ----- Regarding: needs outpatient DCCV Pt has had therapeutic INRs over the past 3 weeks. Can we see about getting him scheduled for an outpatient DCCV for persistent atrial fibrillation late next week? He is a [facility] pt. I saw him a few weeks ago and we were waiting until he was therapeutic. Thanks.

## 2018-02-13 NOTE — Progress Notes (Signed)
Cardioversion letter. Patient will pick up when he comes in for labs on 02/17/18. Letter placed up front for pick up.

## 2018-02-17 ENCOUNTER — Ambulatory Visit (INDEPENDENT_AMBULATORY_CARE_PROVIDER_SITE_OTHER): Payer: Medicare Other | Admitting: *Deleted

## 2018-02-17 ENCOUNTER — Other Ambulatory Visit: Payer: Medicare Other

## 2018-02-17 DIAGNOSIS — Z79899 Other long term (current) drug therapy: Secondary | ICD-10-CM

## 2018-02-17 DIAGNOSIS — I4891 Unspecified atrial fibrillation: Secondary | ICD-10-CM

## 2018-02-17 DIAGNOSIS — Z7901 Long term (current) use of anticoagulants: Secondary | ICD-10-CM

## 2018-02-17 DIAGNOSIS — Z5181 Encounter for therapeutic drug level monitoring: Secondary | ICD-10-CM | POA: Diagnosis not present

## 2018-02-17 HISTORY — DX: Encounter for therapeutic drug level monitoring: Z51.81

## 2018-02-17 LAB — BASIC METABOLIC PANEL
BUN / CREAT RATIO: 16 (ref 10–24)
BUN: 18 mg/dL (ref 8–27)
CO2: 23 mmol/L (ref 20–29)
CREATININE: 1.14 mg/dL (ref 0.76–1.27)
Calcium: 9 mg/dL (ref 8.6–10.2)
Chloride: 106 mmol/L (ref 96–106)
GFR, EST AFRICAN AMERICAN: 72 mL/min/{1.73_m2} (ref >59)
GFR, EST NON AFRICAN AMERICAN: 63 mL/min/{1.73_m2} (ref >59)
Glucose: 94 mg/dL (ref 65–99)
Potassium: 3.9 mmol/L (ref 3.5–5.2)
SODIUM: 143 mmol/L (ref 134–144)

## 2018-02-17 LAB — POCT INR: INR: 4.9

## 2018-02-17 NOTE — Patient Instructions (Signed)
Description   Hold today's dose then start taking 1 tablet daily except 1/2 tablet on Saturdays.  Recheck INR in 1 week pending DCCV on 02/19/18. Coumadin Clinic [phone]  Main [phone]

## 2018-02-19 ENCOUNTER — Encounter (HOSPITAL_COMMUNITY): Payer: Self-pay | Admitting: *Deleted

## 2018-02-19 ENCOUNTER — Ambulatory Visit (HOSPITAL_COMMUNITY)
Admission: RE | Admit: 2018-02-19 | Discharge: 2018-02-19 | Disposition: A | Discharge status: Home / Self Care | Payer: Medicare Other | Source: Ambulatory Visit | Attending: Cardiology | Admitting: Cardiology

## 2018-02-19 ENCOUNTER — Other Ambulatory Visit: Payer: Self-pay

## 2018-02-19 ENCOUNTER — Ambulatory Visit (HOSPITAL_COMMUNITY): Payer: Medicare Other

## 2018-02-19 ENCOUNTER — Encounter (HOSPITAL_COMMUNITY)
Admission: RE | Disposition: A | Discharge status: Home / Self Care | Payer: Self-pay | Source: Ambulatory Visit | Attending: Cardiology

## 2018-02-19 DIAGNOSIS — I4891 Unspecified atrial fibrillation: Secondary | ICD-10-CM | POA: Diagnosis not present

## 2018-02-19 DIAGNOSIS — Z87891 Personal history of nicotine dependence: Secondary | ICD-10-CM | POA: Diagnosis not present

## 2018-02-19 DIAGNOSIS — Z79899 Other long term (current) drug therapy: Secondary | ICD-10-CM | POA: Diagnosis not present

## 2018-02-19 DIAGNOSIS — D649 Anemia, unspecified: Secondary | ICD-10-CM | POA: Diagnosis not present

## 2018-02-19 DIAGNOSIS — I481 Persistent atrial fibrillation: Secondary | ICD-10-CM | POA: Insufficient documentation

## 2018-02-19 DIAGNOSIS — Z7901 Long term (current) use of anticoagulants: Secondary | ICD-10-CM | POA: Insufficient documentation

## 2018-02-19 DIAGNOSIS — I1310 Hypertensive heart and chronic kidney disease without heart failure, with stage 1 through stage 4 chronic kidney disease, or unspecified chronic kidney disease: Secondary | ICD-10-CM | POA: Insufficient documentation

## 2018-02-19 DIAGNOSIS — I272 Pulmonary hypertension, unspecified: Secondary | ICD-10-CM | POA: Diagnosis not present

## 2018-02-19 DIAGNOSIS — I081 Rheumatic disorders of both mitral and tricuspid valves: Secondary | ICD-10-CM | POA: Diagnosis not present

## 2018-02-19 HISTORY — PX: CARDIOVERSION: SHX1299

## 2018-02-19 LAB — PROTIME-INR
INR: 3.18
PROTHROMBIN TIME: 32.3 s — AB (ref 11.4–15.2)

## 2018-02-19 LAB — BASIC METABOLIC PANEL
ANION GAP: 12 (ref 5–15)
BUN: 18 mg/dL (ref 6–20)
CHLORIDE: 107 mmol/L (ref 101–111)
CO2: 21 mmol/L — AB (ref 22–32)
Calcium: 9 mg/dL (ref 8.9–10.3)
Creatinine, Ser: 1.21 mg/dL (ref 0.61–1.24)
GFR calc non Af Amer: 57 mL/min — ABNORMAL LOW (ref >60)
Glucose, Bld: 92 mg/dL (ref 65–99)
POTASSIUM: 3.8 mmol/L (ref 3.5–5.1)
Sodium: 140 mmol/L (ref 135–145)

## 2018-02-19 LAB — CBC WITH DIFFERENTIAL/PLATELET
BASOS PCT: 1 %
Basophils Absolute: 0 10*3/uL (ref 0.0–0.1)
Eosinophils Absolute: 0.2 10*3/uL (ref 0.0–0.7)
Eosinophils Relative: 5 %
HEMATOCRIT: 32.1 % — AB (ref 39.0–52.0)
Hemoglobin: 10.1 g/dL — ABNORMAL LOW (ref 13.0–17.0)
Lymphocytes Relative: 22 %
Lymphs Abs: 1.1 10*3/uL (ref 0.7–4.0)
MCH: 27 pg (ref 26.0–34.0)
MCHC: 31.5 g/dL (ref 30.0–36.0)
MCV: 85.8 fL (ref 78.0–100.0)
MONO ABS: 0.4 10*3/uL (ref 0.1–1.0)
MONOS PCT: 8 %
NEUTROS ABS: 3.2 10*3/uL (ref 1.7–7.7)
Neutrophils Relative %: 66 %
Platelets: 222 10*3/uL (ref 150–400)
RBC: 3.74 MIL/uL — ABNORMAL LOW (ref 4.22–5.81)
RDW: 14.6 % (ref 11.5–15.5)
WBC: 4.8 10*3/uL (ref 4.0–10.5)

## 2018-02-19 SURGERY — CARDIOVERSION
Anesthesia: General

## 2018-02-19 MED ORDER — LIDOCAINE 2% (20 MG/ML) 5 ML SYRINGE
INTRAMUSCULAR | Status: DC | PRN
  Administered 2018-02-19: 100 mg via INTRAVENOUS

## 2018-02-19 MED ORDER — PROPOFOL 10 MG/ML IV BOLUS
INTRAVENOUS | Status: DC | PRN
  Administered 2018-02-19: 80 mg via INTRAVENOUS

## 2018-02-19 MED ORDER — SODIUM CHLORIDE 0.9 % IV SOLN
INTRAVENOUS | Status: DC
  Administered 2018-02-19: 08:00:00 via INTRAVENOUS

## 2018-02-19 NOTE — Interval H&P Note (Signed)
History and Physical Interval Note:  02/19/2018 8:53 AM  Adam Willis  has presented today for surgery, with the diagnosis of AFIB  The various methods of treatment have been discussed with the patient and family. After consideration of risks, benefits and other options for treatment, the patient has consented to  Procedure(s): CARDIOVERSION (N/A) as a surgical intervention .  The patient's history has been reviewed, patient examined, no change in status, stable for surgery.  I have reviewed the patient's chart and labs.  Questions were answered to the patient's satisfaction.     Dalton [employer]

## 2018-02-19 NOTE — Discharge Instructions (Signed)
Electrical Cardioversion, Care After °This sheet gives you information about how to care for yourself after your procedure. Your health care provider may also give you more specific instructions. If you have problems or questions, contact your health care provider. °What can I expect after the procedure? °After the procedure, it is common to have: °· Some redness on the skin where the shocks were given. ° °Follow these instructions at home: °· Do not drive for 24 hours if you were given a medicine to help you relax (sedative). °· Take over-the-counter and prescription medicines only as told by your health care provider. °· Ask your health care provider how to check your pulse. Check it often. °· Rest for 48 hours after the procedure or as told by your health care provider. °· Avoid or limit your caffeine use as told by your health care provider. °Contact a health care provider if: °· You feel like your heart is beating too quickly or your pulse is not regular. °· You have a serious muscle cramp that does not go away. °Get help right away if: °· You have discomfort in your chest. °· You are dizzy or you feel faint. °· You have trouble breathing or you are short of breath. °· Your speech is slurred. °· You have trouble moving an arm or leg on one side of your body. °· Your fingers or toes turn cold or blue. °This information is not intended to replace advice given to you by your health care provider. Make sure you discuss any questions you have with your health care provider. °Document Released: 09/22/2013 Document Revised: 07/05/2016 Document Reviewed: 06/07/2016 °Elsevier Interactive Patient Education © 2018 Elsevier Inc. ° °

## 2018-02-19 NOTE — Procedures (Signed)
Electrical Cardioversion Procedure Note Arion Bufkin [id number] 01-17-1941  Procedure: Electrical Cardioversion Indications:  Atrial Fibrillation  Procedure Details Consent: Risks of procedure as well as the alternatives and risks of each were explained to the (patient/caregiver).  Consent for procedure obtained. Time Out: Verified patient identification, verified procedure, site/side was marked, verified correct patient position, special equipment/implants available, medications/allergies/relevent history reviewed, required imaging and test results available.  Performed  Patient placed on cardiac monitor, pulse oximetry, supplemental oxygen as necessary.  Sedation given: Propofol per anesthesiology Pacer pads placed anterior and posterior chest.  Cardioverted 1 time(s).  Cardioverted at 200J.  Evaluation Findings: Post procedure EKG shows: NSR Complications: None Patient did tolerate procedure well.   Marca Ancona 02/19/2018, 9:19 AM

## 2018-02-19 NOTE — Anesthesia Preprocedure Evaluation (Addendum)
Anesthesia Evaluation  Patient identified by MRN, date of birth, ID band Patient awake    Reviewed: Allergy & Precautions, NPO status , Patient's Chart, lab work & pertinent test results  Airway Mallampati: I       Dental  (+) Upper Dentures, Poor Dentition   Pulmonary former smoker,    Pulmonary exam normal        Cardiovascular hypertension, Pt. on medications Normal cardiovascular exam     Neuro/Psych    GI/Hepatic negative GI ROS, Neg liver ROS,   Endo/Other  negative endocrine ROS  Renal/GU negative Renal ROS  negative genitourinary   Musculoskeletal negative musculoskeletal ROS (+)   Abdominal Normal abdominal exam  (+)   Peds  Hematology   Anesthesia Other Findings Adam Willis  CARDIAC CATHETERIZATION  Order# [id number]  Reading physician: Laurey Morale, MD Ordering physician: Laurey Morale, MD Study date: 10/06/17 Physicians   Panel Physicians Referring Physician Case Authorizing Physician Laurey Morale, MD (Primary)   Procedures   RIGHT/LEFT HEART CATH AND CORONARY ANGIOGRAPHY Conclusion   1. Mildly elevated right heart filling pressure, normal left heart filling pressure.   2. Mild pulmonary hypertension, PVR not significantly elevated.  3. No significant coronary disease. '  More elevated PA pressure on initial echo may have been due to acute effects of cocaine use.  Procedural   Avery Millstein  ECHO COMPLETE WO IMAGING ENHANCING AGENT  Order# [id number]  Reading physician: Lewayne Bunting, MD Ordering physician: Eduard Clos, MD Study date: 10/03/17 Study Result   Result status: Final result                             *[place]*                   *[facility]*                         [address]                        [place], [place] [address]                             [phone]  ------------------------------------------------------------------- Transthoracic Echocardiography  Patient:    Adam Willis, Adam Willis MR #:       [id number] Study Date: 10/03/2017 Gender:     M Age:        77 Height:     185.4 cm Weight:     99.8 kg BSA:        2.29 m^2 Pt. Status: Room:       [facility]     Eduard Clos  SONOGRAPHER  Delcie Roch, RDCS, CCT  ATTENDING    [place], [place] [id number]  PERFORMING   [facility], Inpatient  cc:  ------------------------------------------------------------------- LV EF: 50% -   55%  ------------------------------------------------------------------- Indications:      CHF - 428.0.  ------------------------------------------------------------------- History:   PMH:   Dyspnea.  Atrial fibrillation.  ------------------------------------------------------------------- Study Conclusions  - Left ventricle: The cavity size was normal. Wall thickness was   increased in a pattern of moderate LVH. Systolic function was   normal. The estimated ejection fraction was in the range of 50%   to 55%. Wall motion was normal; there were no regional wall   motion abnormalities. -  Mitral valve: There was mild regurgitation. - Left atrium: The atrium was moderately dilated. - Right ventricle: The cavity size was mildly dilated. Systolic   function was mildly reduced. - Right atrium: The atrium was mildly dilated. - Pulmonary arteries: Systolic pressure was moderately to severely   increased. PA peak pressure: 67 mm Hg (S). - Pericardium, extracardiac: A trivial pericardial effusion was   identified.  Impressions:  - Normal LV systolic function; moderate LVH; mild MR; biatrial   enlargement; mild RVE with mildly reduced function; mild to   moderate TR with moderate to severe pulmonary hypertension.  ------------------------------------------------------------------- Study data:  No prior study was available for  comparison.  Study status:  Routine.  Procedure:  The patient reported no pain pre or post test. Transthoracic echocardiography. Image quality was good. Study completion:  There were no complications. Transthoracic echocardiography.  M-mode, complete 2D, spectral Doppler, and color Doppler.  Birthdate:  Patient birthdate: August 19, 1941.  Age:  Patient is 77 yr old.  Sex:  Gender: male. BMI: 29 kg/m^2.  Blood pressure:     156/92  Patient status: Inpatient.  Study date:  Study date: 10/03/2017. Study time: 12:06 PM.  Location:  Emergency department.  -------------------------------------------------------------------  ------------------------------------------------------------------- Left ventricle:  The cavity size was normal. Wall thickness was increased in a pattern of moderate LVH. Systolic function was normal. The estimated ejection fraction was in the range of 50% to 55%. Wall motion was normal; there were no regional wall motion abnormalities. The study was not technically sufficient to allow evaluation of LV diastolic dysfunction due to atrial fibrillation.   ------------------------------------------------------------------- Aortic valve:   Trileaflet; mildly thickened leaflets. Mobility was not restricted.  Doppler:  Transvalvular velocity was within the normal range. There was no stenosis. There was no regurgitation.   ------------------------------------------------------------------- Aorta:  Aortic root: The aortic root was normal in size.  ------------------------------------------------------------------- Mitral valve:   Mildly thickened leaflets . Mobility was not restricted.  Doppler:  Transvalvular velocity was within the normal range. There was no evidence for stenosis. There was mild regurgitation.  ------------------------------------------------------------------- Left atrium:  The atrium was moderately  dilated.  ------------------------------------------------------------------- Right ventricle:  The cavity size was mildly dilated. Systolic function was mildly reduced.  ------------------------------------------------------------------- Pulmonic valve:    Doppler:  Transvalvular velocity was within the normal range. There was no evidence for stenosis. There was trivial regurgitation.  ------------------------------------------------------------------- Tricuspid valve:   Structurally normal valve.    Doppler: Transvalvular velocity was within the normal range. There was mild regurgitation.  ------------------------------------------------------------------- Pulmonary artery:   Systolic pressure was moderately to severely increased.  ------------------------------------------------------------------- Right atrium:  The atrium was mildly dilated.  ------------------------------------------------------------------- Pericardium:  A trivial pericardial effusion was identified.  ------------------------------------------------------------------- Systemic veins: Inferior vena cava: The vessel was mildly dilated.  ------------------------------------------------------------------- Measurements   Left ventricle                         Value        Reference  LV ID, ED, PLAX chordal                49    mm     43 - 52  LV ID, ES, PLAX chordal        (H)     39    mm     23 - 38  LV fx shortening, PLAX chordal (L)     20    %      >=29  LV PW thickness, ED                    12.16 mm     ----------  IVS/LV PW ratio, ED                    1.16         <=1.3    Ventricular septum                     Value        Reference  IVS thickness, ED                      14.07 mm     ----------    LVOT                                   Value        Reference  LVOT ID, S                             21    mm     ----------  LVOT area                              3.46  cm^2    ----------    Aorta                                  Value        Reference  Aortic root ID, ED                     34    mm     ----------  Ascending aorta ID, A-P, S             32    mm     ----------    Left atrium                            Value        Reference  LA ID, A-P, ES                         51    mm     ----------  LA ID/bsa, A-P                 (H)     2.23  cm/m^2 <=2.2  LA volume, S                           94.6  ml     ----------  LA volume/bsa, S                       41.4  ml/m^2 ----------  LA volume, ES, 1-p A4C                 88.5  ml     ----------  LA volume/bsa, ES, 1-p A4C             38.7  ml/m^2 ----------  LA volume, ES,  1-p A2C                 92.4  ml     ----------  LA volume/bsa, ES, 1-p A2C             40.4  ml/m^2 ----------    Pulmonary arteries                     Value        Reference  PA pressure, S, DP             (H)     67    mm Hg  <=30    Tricuspid valve                        Value        Reference  Tricuspid regurg peak velocity         360   cm/s   ----------  Tricuspid peak RV-RA gradient          52    mm Hg  ----------    Right atrium                           Value        Reference  RA ID, S-I, ES, A4C            (H)     68.1  mm     34 - 49  RA area, ES, A4C               (H)     24.7  cm^2   8.3 - 19.5  RA volume, ES, A/L                     73.5  ml     ----------  RA volume/bsa, ES, A/L                 32.1  ml/m^2 ----------    Systemic veins                         Value        Reference  Estimated CVP                          3     mm Hg  ----------    Right ventricle                        Value        Reference  RV pressure, S, DP             (H)     55    mm Hg  <=30  Legend: (L)  and  (H)  mark values outside specified reference range.  ------------------------------------------------------------------- Prepared and Electronically Authenticated by  Olga Millers 2018-10-19T12:57:03 MERGE Images    Show images for ECHOCARDIOGRAM COMPLETE Patient Information   Patient Name Adam Willis, Adam Willis Sex Male DOB [birth year] SSN [id number] Reason For Exam  Priority: Routine  Not on file Surgical History   Surgical History    No past medical history on file.  Other Surgical History    Procedure Laterality Date Comment Source RIGHT/LEFT HEART CATH AND CORONARY ANGIOGRAPHY N/A 10/06/2017 Procedure: RIGHT/LEFT HEART CATH AND CORONARY ANGIOGRAPHY; Surgeon: Laurey Morale,  MD; Location: [facility] INVASIVE CV LAB; Service: Cardiovascular; Laterality: N/A; Provider  Patient Data   Height  73 in  BP  156/92 mmHg    Performing Technologist/Nurse   Performing Technologist/Nurse: Jacqlyn Krauss, RDCS  2D Measurements    LV PW d  15 mm (Range: 0.6 - 1.1) Abnormal   FS  20 % (Range: 28 - 44) Abnormal   LA ID, A-P, ES  51 mm  LA diam end sys  51 mm  LA vol  94.6 mL  LA vol index  42.2 mL/m2  IVS/LV PW RATIO, ED  1   FS  20 % (Range: 28 - 44) Abnormal      Aortic Valve Measurements    Stenosis LVOT diameter  21 mm  LVOT area  3.46 cm2       Aortic Root Measurements - End Diastolic    Ao-asc  32 cm    Left Atrium Measurements    LA ID, A-P, ES  51 mm  LA vol  94.6 mL  LA vol index  42.2 mL/m2  LA vol A4C  88.5 ml       Implants     No active implants to display in this view. Order-Level Documents - 10/03/2017:   Scan on 10/03/2017 10:51 AM by Default, Provider, MDScan on 10/03/2017 10:51 AM by Default, Provider, MD    Encounter-Level Documents - 10/03/2017:   Scan on 10/09/2017 1:32 PM by Default, Provider, MDScan on 10/09/2017 1:32 PM by Default, Provider, MD  Scan on 10/09/2017 1:22 PM by Default, Provider, MDScan on 10/09/2017 1:22 PM by Default, Provider, MD  Document on 10/08/2017 11:01 AM by Raymon Mutton, RN: IP After Visit Summary  Document on 10/07/2017 4:18 PM by Daryll Brod: ED PB Summary  Scan  on 10/06/2017 4:14 PM by Default, Provider, MDScan on 10/06/2017 4:14 PM by Default, Provider, MD  Scan on 10/04/2017 9:54 AM by Default, Provider, MDScan on 10/04/2017 9:54 AM by Default, Provider, MD  Scan on 10/08/2017 4:59 AM by Default, Provider, MDScan on 10/08/2017 4:59 AM by Default, Provider, MD  Scan on 10/08/2017 9:09 AM by Default, Provider, MDScan on 10/08/2017 9:09 AM by Default, Provider, MD  Scan on 10/03/2017 4:37 AM by Default, Provider, MDScan on 10/03/2017 4:37 AM by Default, Provider, MD  Electronic signature on 10/03/2017 4:23 AM  Electronic signature on 10/03/2017 4:23 AM    Signed   Electronically signed by Lewayne Bunting, MD on 10/03/17 at 1257 EDT Printable Result Report    Result Report  External Result Report    External Result Report     Reproductive/Obstetrics                           Anesthesia Physical Anesthesia Plan  ASA: II  Anesthesia Plan: General   Post-op Pain Management:    Induction:   PONV Risk Score and Plan: 2 and Treatment may vary due to age or medical condition  Airway Management Planned: Mask  Additional Equipment:   Intra-op Plan:   Post-operative Plan:   Informed Consent: I have reviewed the patients History and Physical, chart, labs and discussed the procedure including the risks, benefits and alternatives for the proposed anesthesia with the patient or authorized representative who has indicated his/her understanding and acceptance.     Plan Discussed with: CRNA and Surgeon  Anesthesia Plan Comments:         Anesthesia Quick Evaluation

## 2018-02-19 NOTE — Anesthesia Postprocedure Evaluation (Signed)
Anesthesia Post Note  Patient: Adam Willis  Procedure(s) Performed: CARDIOVERSION (N/A )     Patient location during evaluation: PACU Anesthesia Type: General Level of consciousness: awake Pain management: pain level controlled Vital Signs Assessment: post-procedure vital signs reviewed and stable Respiratory status: spontaneous breathing Cardiovascular status: stable Postop Assessment: no apparent nausea or vomiting Anesthetic complications: no    Last Vitals:  Vitals:   02/19/18 0720 02/19/18 0925  BP: (!) 192/101 (!) 173/90  Pulse: 80 70  Resp: 20 20  Temp: 36.6 C 36.4 C  SpO2: 97% 98%    Last Pain:  Vitals:   02/19/18 0925  TempSrc: Oral   Pain Goal:                 Adam Willis,Adam Willis

## 2018-02-19 NOTE — Transfer of Care (Signed)
Immediate Anesthesia Transfer of Care Note  Patient: Adam Willis  Procedure(s) Performed: CARDIOVERSION (N/A )  Patient Location: Endoscopy Unit  Anesthesia Type:General  Level of Consciousness: awake, alert  and oriented  Airway & Oxygen Therapy: Patient Spontanous Breathing and Patient connected to nasal cannula oxygen  Post-op Assessment: Report given to RN, Post -op Vital signs reviewed and stable and Patient moving all extremities X 4  Post vital signs: Reviewed and stable  Last Vitals:  Vitals:   02/19/18 0720  BP: (!) 192/101  Pulse: 80  Resp: 20  Temp: 36.6 C  SpO2: 97%    Last Pain:  Vitals:   02/19/18 0720  TempSrc: Oral         Complications: No apparent anesthesia complications

## 2018-02-23 ENCOUNTER — Encounter: Payer: Self-pay | Admitting: Cardiology

## 2018-02-23 ENCOUNTER — Ambulatory Visit (INDEPENDENT_AMBULATORY_CARE_PROVIDER_SITE_OTHER): Payer: Medicare Other | Admitting: *Deleted

## 2018-02-23 ENCOUNTER — Ambulatory Visit (INDEPENDENT_AMBULATORY_CARE_PROVIDER_SITE_OTHER): Payer: Medicare Other | Admitting: Cardiology

## 2018-02-23 ENCOUNTER — Telehealth: Payer: Self-pay | Admitting: *Deleted

## 2018-02-23 VITALS — BP 144/90 | HR 62 | Ht 76.0 in | Wt 234.0 lb

## 2018-02-23 DIAGNOSIS — I4892 Unspecified atrial flutter: Secondary | ICD-10-CM

## 2018-02-23 DIAGNOSIS — I481 Persistent atrial fibrillation: Secondary | ICD-10-CM

## 2018-02-23 DIAGNOSIS — Z5181 Encounter for therapeutic drug level monitoring: Secondary | ICD-10-CM

## 2018-02-23 DIAGNOSIS — R0683 Snoring: Secondary | ICD-10-CM | POA: Diagnosis not present

## 2018-02-23 DIAGNOSIS — I4891 Unspecified atrial fibrillation: Secondary | ICD-10-CM

## 2018-02-23 DIAGNOSIS — Z7901 Long term (current) use of anticoagulants: Secondary | ICD-10-CM | POA: Diagnosis not present

## 2018-02-23 DIAGNOSIS — I4819 Other persistent atrial fibrillation: Secondary | ICD-10-CM

## 2018-02-23 LAB — POCT INR: INR: 3.8

## 2018-02-23 NOTE — Telephone Encounter (Signed)
Sent to pre cert/sleep pool ESS=6

## 2018-02-23 NOTE — Patient Instructions (Addendum)
Medication Instructions: Your physician recommends that you continue on your current medications as directed. Please refer to the Current Medication list given to you today.  Labwork: None Ordered  Procedures/Testing: Your physician has recommended that you have a sleep study. This test records several body functions during sleep, including: brain activity, eye movement, oxygen and carbon dioxide blood levels, heart rate and rhythm, breathing rate and rhythm, the flow of air through your mouth and nose, snoring, body muscle movements, and chest and belly movement.  Follow-Up: You have been referred to the Atrial Fibrillation Clinic with Sebastian Ache, NP - 1st available for Persistent Atrial Fibrillation  Your physician recommends that you schedule a follow-up appointment in 3 MONTHS with Dr. Mayford Knife   If you need a refill on your cardiac medications before your next appointment, please call your pharmacy.

## 2018-02-23 NOTE — Telephone Encounter (Signed)
-----   Message from Rebbeca Paul, [place] sent at 02/23/2018  9:58 AM EDT ----- Regarding: Sleep Study Patient needs a sleep study for A-Flutter and snoring per Robbie Lis, PA-C please. Orders are in.  Thanks :)

## 2018-02-23 NOTE — Patient Instructions (Signed)
Description   Skip today's dose, then change your dose to 1 tablet daily except 1/2 tablet on Sundays and Thursdays. Recheck INR in 10 days.  Coumadin Clinic [phone]  Main [phone]

## 2018-02-23 NOTE — Progress Notes (Signed)
02/23/2018 Adam Willis   06-03-1941  [id number]  Primary Physician Sherrill Raring, MD Primary Cardiologist: Dr. Mayford Knife   Reason for Visit/CC: Post Procedure F/u, s/p DCCV for Persistent Atrial Fibrillation  HPI:  Adam Willis a 77 y.o.malewith a hx of persistent atrial fibrillation with a CHADS2VASC score of 3 on chronic anticoagulation with coumadin (cannot afford NOAC - no insurance), mild pulmonary HTN, polysubstance abuse (cocaine), CKD and HTN. He was recently dx with new onset atrial fibrillation and mild trop elevation in October 2018.  TSH was normal.2D echo showed low normal LVF with moderate to severe pulmonary HTN. He underwent cardiac cath showing mild pulmonary HTN, no CAD and mildly elevated left heart pressures. He was started on Coumadin and Cardizem for rate control. He was recently seen by Dr. Mayford Knife 12/22/17 and was still in atrial fibrillation and set up for DCCV. Cardioversion was arranged for 1/28, however, had to be canceled given failure to maintain therapeutic INRs x 3 consecutive weeks.   Attempt was retried. He was followed weekly in our coumadin clinic and he was able to maintain therapeutic INRs for 3 consecutive weeks and was set up for outpatient DCCV at [facility] on 02/19/18. Procedure was performed by Dr. Clifton James and was successful after 1 attempt at 200J.  He presents back to clinic today for post hospital f/u. EKG shows Atrial Flutter w/ a CVR. HR in the 60s. He is asymptomatic. BP is 154/90. He just took his AM BP meds < 1 hrs ago. He had a coumadin check earlier today and INR was therapeutic at 3.8. Denies abnormal bleeding. He also denies excess caffeine intake. No ETOH. He has been told that he snores on occasion. He was ordered to get a sleep study several years ago, but never did.  Cardiac Procedures  Procedure: Electrical Cardioversion Indications:  Atrial Fibrillation  Procedure Details Consent: Risks of procedure as well as the  alternatives and risks of each were explained to the (patient/caregiver).  Consent for procedure obtained. Time Out: Verified patient identification, verified procedure, site/side was marked, verified correct patient position, special equipment/implants available, medications/allergies/relevent history reviewed, required imaging and test results available.  Performed  Patient placed on cardiac monitor, pulse oximetry, supplemental oxygen as necessary.  Sedation given: Propofol per anesthesiology Pacer pads placed anterior and posterior chest.  Cardioverted 1 time(s).  Cardioverted at 200J.  Evaluation Findings: Post procedure EKG shows: NSR Complications: None Patient did tolerate procedure well. _______________________  Procedures   RIGHT/LEFT HEART CATH AND CORONARY ANGIOGRAPHY 10/06/17  Conclusion   1. Mildly elevated right heart filling pressure, normal left heart filling pressure.   2. Mild pulmonary hypertension, PVR not significantly elevated.  3. No significant coronary disease. '  More elevated PA pressure on initial echo may have been due to acute effects of cocaine use.    ___________________________  2D Echo 10/03/17 Study Conclusions  - Left ventricle: The cavity size was normal. Wall thickness was   increased in a pattern of moderate LVH. Systolic function was   normal. The estimated ejection fraction was in the range of 50%   to 55%. Wall motion was normal; there were no regional wall   motion abnormalities. - Mitral valve: There was mild regurgitation. - Left atrium: The atrium was moderately dilated. - Right ventricle: The cavity size was mildly dilated. Systolic   function was mildly reduced. - Right atrium: The atrium was mildly dilated. - Pulmonary arteries: Systolic pressure was moderately to severely   increased. PA peak  pressure: 67 mm Hg (S). - Pericardium, extracardiac: A trivial pericardial effusion was   identified.  Impressions:  -  Normal LV systolic function; moderate LVH; mild MR; biatrial   enlargement; mild RVE with mildly reduced function; mild to   moderate TR with moderate to severe pulmonary hypertension.    Current Meds  Medication Sig  . acetaminophen (TYLENOL) 650 MG CR tablet Take 1,300 mg by mouth every 8 (eight) hours as needed for pain.  Marland Kitchen atorvastatin (LIPITOR) 20 MG tablet Take 1 tablet (20 mg total) by mouth daily.  Marland Kitchen diltiazem (CARDIZEM CD) 240 MG 24 hr capsule Take 1 capsule (240 mg total) by mouth daily.  . famotidine-calcium carbonate-magnesium hydroxide (PEPCID COMPLETE) 10-800-165 MG chewable tablet Chew 1 tablet by mouth 2 (two) times daily.  . hydrALAZINE (APRESOLINE) 25 MG tablet Take 1 tablet (25 mg total) by mouth every 8 (eight) hours.  . Menthol, Topical Analgesic, (ABSORBINE JR BACK PATCH EX) Apply 1 patch topically daily as needed (for pain).  Marland Kitchen warfarin (COUMADIN) 5 MG tablet TAKE ONE TABLET BY MOUTH AS DIRECTED (Patient taking differently: Take 5 mg by mouth daily)   No Known Allergies Past Medical History:  Diagnosis Date  . Heart murmur   . Hypertension    Family History  Problem Relation Age of Onset  . Hypertension Brother   . Alzheimer's disease Mother 33  . CAD Father 55   Past Surgical History:  Procedure Laterality Date  . CARDIOVERSION N/A 02/19/2018   Procedure: CARDIOVERSION;  Surgeon: Laurey Morale, MD;  Location: [facility] ENDOSCOPY;  Service: Cardiovascular;  Laterality: N/A;  . RIGHT/LEFT HEART CATH AND CORONARY ANGIOGRAPHY N/A 10/06/2017   Procedure: RIGHT/LEFT HEART CATH AND CORONARY ANGIOGRAPHY;  Surgeon: Laurey Morale, MD;  Location: [facility] INVASIVE CV LAB;  Service: Cardiovascular;  Laterality: N/A;   Social History   Socioeconomic History  . Marital status: Single    Spouse name: Not on file  . Number of children: Not on file  . Years of education: Not on file  . Highest education level: Not on file  Social Needs  . Financial resource strain: Not on  file  . Food insecurity - worry: Not on file  . Food insecurity - inability: Not on file  . Transportation needs - medical: Not on file  . Transportation needs - non-medical: Not on file  Occupational History  . 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 Smoking status: Former Smoker    Years: 10.00    Types: Cigarettes    Last attempt to quit: 10/05/2017    Years since quitting: 0.3  . Smokeless tobacco: Never Used  . Tobacco comment: cut back to 1-2/day in 2016  Substance and Sexual Activity  . Alcohol use: No  . Drug use: Yes    Types: Other-see comments  . Sexual activity: Not on file  Other Topics Concern  . Not on file  Social History Narrative   He lives in [place]. He has a fiance.     Review of Systems: General: negative for chills, fever, night sweats or weight changes.  Cardiovascular: negative for chest pain, dyspnea on exertion, edema, orthopnea, palpitations, paroxysmal nocturnal dyspnea or shortness of breath Dermatological: negative for rash Respiratory: negative for cough or wheezing Urologic: negative for hematuria Abdominal: negative for nausea, vomiting, diarrhea, bright red blood per rectum, melena, or hematemesis Neurologic: negative for visual changes, syncope, or dizziness All other systems reviewed and are otherwise  negative except as noted above.   Physical Exam:  Blood pressure (!) 154/90, pulse 62, height 6\' 4"  (1.93 m), weight 234 lb (106.1 kg).  General appearance: alert, cooperative and no distress Neck: no carotid bruit and no JVD Lungs: clear to auscultation bilaterally Heart: regular rate and rhythm and irregular rhythm, regular rate Extremities: no LEE Pulses: 2+ and symmetric Skin: Skin color, texture, turgor normal. No rashes or lesions Neurologic: Grossly normal  EKG Atrial flutter with CVS -- personally reviewed   ASSESSMENT AND PLAN:   1. Persistent Afib/Flutter: TSH normal at time of initial  diagnosis. Echo with low normal LVEF, 50%. LHC negative for CAD. He had recent failed DCCV on 02/19/18. His rate is controlled with Cardizem, in the low 60s, and he is asymptomatic. He is on anticoagulation with Coumadin and INR was therapeutic today at 3.8. We will refer him to the Afib Clinic for further management. Will also order a sleep study given h/o snoring. Continue Cardizem and Coumadin for now. We discussed avoidance of caffeine.   PLAN  Refer to Afib Clinic + Sleep Study   Follow-Up w/ Dr. Mayford Knife in 3 months   [name], MHS [facility] 02/23/2018 12:17 PM

## 2018-02-25 ENCOUNTER — Ambulatory Visit (HOSPITAL_COMMUNITY): Payer: Medicare Other | Admitting: Nurse Practitioner

## 2018-03-01 ENCOUNTER — Encounter (HOSPITAL_BASED_OUTPATIENT_CLINIC_OR_DEPARTMENT_OTHER): Payer: Medicare Other

## 2018-03-05 ENCOUNTER — Encounter (HOSPITAL_COMMUNITY): Payer: Self-pay | Admitting: Nurse Practitioner

## 2018-03-05 ENCOUNTER — Ambulatory Visit (HOSPITAL_COMMUNITY)
Admission: RE | Admit: 2018-03-05 | Discharge: 2018-03-05 | Disposition: A | Discharge status: Home / Self Care | Payer: Medicare Other | Source: Ambulatory Visit | Attending: Nurse Practitioner | Admitting: Nurse Practitioner

## 2018-03-05 ENCOUNTER — Ambulatory Visit (INDEPENDENT_AMBULATORY_CARE_PROVIDER_SITE_OTHER): Payer: Medicare Other | Admitting: *Deleted

## 2018-03-05 VITALS — BP 140/76 | HR 69 | Ht 76.0 in | Wt 230.0 lb

## 2018-03-05 DIAGNOSIS — Z7901 Long term (current) use of anticoagulants: Secondary | ICD-10-CM | POA: Insufficient documentation

## 2018-03-05 DIAGNOSIS — Z5181 Encounter for therapeutic drug level monitoring: Secondary | ICD-10-CM | POA: Diagnosis not present

## 2018-03-05 DIAGNOSIS — I481 Persistent atrial fibrillation: Secondary | ICD-10-CM | POA: Insufficient documentation

## 2018-03-05 DIAGNOSIS — Z9889 Other specified postprocedural states: Secondary | ICD-10-CM | POA: Insufficient documentation

## 2018-03-05 DIAGNOSIS — Z87891 Personal history of nicotine dependence: Secondary | ICD-10-CM | POA: Insufficient documentation

## 2018-03-05 DIAGNOSIS — Z820 Family history of epilepsy and other diseases of the nervous system: Secondary | ICD-10-CM | POA: Insufficient documentation

## 2018-03-05 DIAGNOSIS — Z79899 Other long term (current) drug therapy: Secondary | ICD-10-CM | POA: Insufficient documentation

## 2018-03-05 DIAGNOSIS — I4891 Unspecified atrial fibrillation: Secondary | ICD-10-CM

## 2018-03-05 DIAGNOSIS — I10 Essential (primary) hypertension: Secondary | ICD-10-CM | POA: Diagnosis not present

## 2018-03-05 DIAGNOSIS — I4819 Other persistent atrial fibrillation: Secondary | ICD-10-CM

## 2018-03-05 DIAGNOSIS — I272 Pulmonary hypertension, unspecified: Secondary | ICD-10-CM | POA: Diagnosis not present

## 2018-03-05 DIAGNOSIS — Z8249 Family history of ischemic heart disease and other diseases of the circulatory system: Secondary | ICD-10-CM | POA: Insufficient documentation

## 2018-03-05 LAB — POCT INR: INR: 3.4

## 2018-03-05 NOTE — Progress Notes (Signed)
Primary Care Physician: Sherrill Raring, MD Referring Physician: Dr. Felicity Pellegrini Mangano is a 77 y.o. male with a h/o  persistent atrial fibrillation with a CHADS2VASC score of 3 on chronic anticoagulation with coumadin (cannot afford NOAC - no insurance), mild pulmonary HTN, polysubstance abuse (cocaine), CKD and HTN. He was recently dx with new onset atrial fibrillation and mild trop elevation in October 2018.  TSH was normal.2D echo showed low normal LVF with moderate to severe pulmonary HTN. He underwent cardiac cath showing mild pulmonary HTN, no CAD and mildly elevated left heart pressures. He was started on Coumadin and Cardizem for rate control.He was recently seen by Dr. Mayford Knife 12/22/17 and was still in atrial fibrillation and set up for DCCV. Cardioversion was arranged for 1/28, however, had to be canceled given failure to maintain therapeutic INRs x 3 consecutive weeks.   Attempt was retried. He was followed weekly in the coumadin clinic and he was able to maintain therapeutic INRs for 3 consecutive weeks and was set up for outpatient DCCV at [facility] on 02/19/18. Procedure was performed by Dr. Clifton James and was successful after 1 attempt at 200J.  He presented back to [place], 3/11, for post hospital f/u. EKG shows Atrial Flutter w/ a CVR. HR in the 60s. He is asymptomatic. BP is 154/90. He just took his AM BP meds < 1 hrs ago. He had a coumadin check earlier that day and INR was therapeutic at 3.8. Denies abnormal bleeding. He also denies excess caffeine intake. No ETOH. He has been told that he snores on occasion. He was ordered to get a sleep study several years ago, but never did.  He is in the afib clinic 3/22, for f/u. He is in SR. He feels well. He is not drinking any alcohol, minimal caffeine, no tobacco. He states that he does not think he needs a sleep study. People in his house report soft snoring and no apnea. Continues on warfain.  Today, he denies symptoms of  palpitations, chest pain, shortness of breath, orthopnea, PND, lower extremity edema, dizziness, presyncope, syncope, or neurologic sequela. The patient is tolerating medications without difficulties and is otherwise without complaint today.   Past Medical History:  Diagnosis Date  . Heart murmur   . Hypertension    Past Surgical History:  Procedure Laterality Date  . CARDIOVERSION N/A 02/19/2018   Procedure: CARDIOVERSION;  Surgeon: Laurey Morale, MD;  Location: [facility] ENDOSCOPY;  Service: Cardiovascular;  Laterality: N/A;  . RIGHT/LEFT HEART CATH AND CORONARY ANGIOGRAPHY N/A 10/06/2017   Procedure: RIGHT/LEFT HEART CATH AND CORONARY ANGIOGRAPHY;  Surgeon: Laurey Morale, MD;  Location: [facility] INVASIVE CV LAB;  Service: Cardiovascular;  Laterality: N/A;    Current Outpatient Medications  Medication Sig Dispense Refill  . acetaminophen (TYLENOL) 650 MG CR tablet Take 1,300 mg by mouth every 8 (eight) hours as needed for pain.    Marland Kitchen atorvastatin (LIPITOR) 20 MG tablet Take 1 tablet (20 mg total) by mouth daily. 30 tablet 11  . diltiazem (CARDIZEM CD) 240 MG 24 hr capsule Take 1 capsule (240 mg total) by mouth daily. 30 capsule 9  . famotidine-calcium carbonate-magnesium hydroxide (PEPCID COMPLETE) 10-800-165 MG chewable tablet Chew 1 tablet by mouth 2 (two) times daily.    . hydrALAZINE (APRESOLINE) 25 MG tablet Take 1 tablet (25 mg total) by mouth every 8 (eight) hours. 90 tablet 9  . Menthol, Topical Analgesic, (ABSORBINE JR BACK PATCH EX) Apply 1 patch topically daily as needed (  for pain).    Marland Kitchen warfarin (COUMADIN) 5 MG tablet TAKE ONE TABLET BY MOUTH AS DIRECTED (Patient taking differently: Take 5 mg by mouth daily) 30 tablet 1   No current facility-administered medications for this encounter.     No Known Allergies  Social History   Socioeconomic History  . Marital status: Single    Spouse name: Not on file  . Number of children: Not on file  . Years of education: Not on file  .  Highest education level: Not on file  Occupational History  . Occupation: retired Naval architect with 2 part-time jobs  Social Needs  . Financial resource strain: Not on file  . Food insecurity:    Worry: Not on file    Inability: Not on file  . Transportation needs:    Medical: Not on file    Non-medical: Not on file  Tobacco Use  . Smoking status: Former Smoker    Years: 10.00    Types: Cigarettes    Last attempt to quit: 10/05/2017    Years since quitting: 0.4  . Smokeless tobacco: Never Used  . Tobacco comment: cut back to 1-2/day in 2016  Substance and Sexual Activity  . Alcohol use: No  . Drug use: Yes    Types: Other-see comments  . Sexual activity: Not on file  Lifestyle  . Physical activity:    Days per week: Not on file    Minutes per session: Not on file  . Stress: Not on file  Relationships  . Social connections:    Talks on phone: Not on file    Gets together: Not on file    Attends religious service: Not on file    Active member of club or organization: Not on file    Attends meetings of clubs or organizations: Not on file    Relationship status: Not on file  . Intimate partner violence:    Fear of current or ex partner: Not on file    Emotionally abused: Not on file    Physically abused: Not on file    Forced sexual activity: Not on file  Other Topics Concern  . Not on file  Social History Narrative   He lives in [place]. He has a fiance.    Family History  Problem Relation Age of Onset  . Hypertension Brother   . Alzheimer's disease Mother 57  . CAD Father 55    ROS- All systems are reviewed and negative except as per the HPI above  Physical Exam: Vitals:   03/05/18 0908  BP: 140/76  Pulse: 69  Weight: 230 lb (104.3 kg)  Height: 6\' 4"  (1.93 m)   Wt Readings from Last 3 Encounters:  03/05/18 230 lb (104.3 kg)  02/23/18 234 lb (106.1 kg)  02/19/18 236 lb (107 kg)    Labs: Lab Results  Component Value Date   NA 140 02/19/2018    K 3.8 02/19/2018   CL 107 02/19/2018   CO2 21 (L) 02/19/2018   GLUCOSE 92 02/19/2018   BUN 18 02/19/2018   CREATININE 1.21 02/19/2018   CALCIUM 9.0 02/19/2018   MG 1.9 10/08/2017   Lab Results  Component Value Date   INR 3.4 03/05/2018   No results found for: CHOL, HDL, LDLCALC, TRIG   GEN- The patient is well appearing, alert and oriented x 3 today.   Head- normocephalic, atraumatic Eyes-  Sclera clear, conjunctiva pink Ears- hearing intact Oropharynx- clear Neck- supple, no JVP Lymph- no  cervical lymphadenopathy Lungs- Clear to ausculation bilaterally, normal work of breathing Heart- Regular rate and rhythm, no murmurs, rubs or gallops, PMI not laterally displaced GI- soft, NT, ND, + BS Extremities- no clubbing, cyanosis, or edema MS- no significant deformity or atrophy Skin- no rash or lesion Psych- euthymic mood, full affect Neuro- strength and sensation are intact  EKG-SR with first degree AV block with rare PAC.  Echo-Study Conclusions  - Left ventricle: The cavity size was normal. Wall thickness was   increased in a pattern of moderate LVH. Systolic function was   normal. The estimated ejection fraction was in the range of 50%   to 55%. Wall motion was normal; there were no regional wall   motion abnormalities. - Mitral valve: There was mild regurgitation. - Left atrium: The atrium was moderately dilated. - Right ventricle: The cavity size was mildly dilated. Systolic   function was mildly reduced. - Right atrium: The atrium was mildly dilated. - Pulmonary arteries: Systolic pressure was moderately to severely   increased. PA peak pressure: 67 mm Hg (S). - Pericardium, extracardiac: A trivial pericardial effusion was   identified.  Impressions:  - Normal LV systolic function; moderate LVH; mild MR; biatrial   enlargement; mild RVE with mildly reduced function; mild to   moderate TR with moderate to severe pulmonary hypertension.  R/LH cath-Conclusion    1. Mildly elevated right heart filling pressure, normal left heart filling pressure.   2. Mild pulmonary hypertension, PVR not significantly elevated.  3. No significant coronary disease. '  More elevated PA pressure on initial echo may have been due to acute effects of cocaine use.       Assessment and Plan: 1. Persistent afib General education re afib and triggers Today in SR Continue on 240 mg diltiazem daily Continue warfarin for chadsvasc score of at least 3   2. HTN Stable  F/u with EKG in 2 weeks to see if SR continues   Lupita Leash C. Matthew Folks [facility] [address] [place], [place] [address] [phone]

## 2018-03-05 NOTE — Patient Instructions (Signed)
Description   Skip today's dose, then change your dose to 1 tablet daily except 1/2 tablet on Sundays, Tuesdays and Thursdays. Recheck INR in 2 weeks.  Coumadin Clinic [phone]  Main [phone]

## 2018-03-12 NOTE — Telephone Encounter (Signed)
Patient is scheduled for lab study on 03/27/18. Patient understands he sleep study will be done at [facility] sleep lab. Patient understands he will receive a sleep packet in a week or so. Patient understands to call if he does not receive the sleep packet in a timely manner. Left detailed message on voicemail with date and time of study and informed patient to call back to confirm or reschedule.

## 2018-03-18 ENCOUNTER — Encounter (HOSPITAL_COMMUNITY): Payer: Self-pay | Admitting: Nurse Practitioner

## 2018-03-18 ENCOUNTER — Ambulatory Visit (HOSPITAL_COMMUNITY)
Admission: RE | Admit: 2018-03-18 | Discharge: 2018-03-18 | Disposition: A | Discharge status: Home / Self Care | Payer: Medicare Other | Source: Ambulatory Visit | Attending: Nurse Practitioner | Admitting: Nurse Practitioner

## 2018-03-18 ENCOUNTER — Ambulatory Visit (INDEPENDENT_AMBULATORY_CARE_PROVIDER_SITE_OTHER): Payer: Medicare Other | Admitting: Pharmacist

## 2018-03-18 DIAGNOSIS — I4891 Unspecified atrial fibrillation: Secondary | ICD-10-CM | POA: Diagnosis not present

## 2018-03-18 DIAGNOSIS — I440 Atrioventricular block, first degree: Secondary | ICD-10-CM | POA: Diagnosis not present

## 2018-03-18 DIAGNOSIS — I491 Atrial premature depolarization: Secondary | ICD-10-CM | POA: Diagnosis not present

## 2018-03-18 DIAGNOSIS — Z7901 Long term (current) use of anticoagulants: Secondary | ICD-10-CM | POA: Diagnosis not present

## 2018-03-18 DIAGNOSIS — Z5181 Encounter for therapeutic drug level monitoring: Secondary | ICD-10-CM | POA: Diagnosis not present

## 2018-03-18 LAB — POCT INR: INR: 2.9

## 2018-03-18 NOTE — Patient Instructions (Signed)
Description   Continue 1 tablet daily except 1/2 tablet on Sundays, Tuesdays and Thursdays. Recheck INR in 3 weeks.  Coumadin Clinic [phone]  Main [phone]

## 2018-03-18 NOTE — Progress Notes (Addendum)
Pt in for EKG and BP check.   To be reviewed by Rudi Coco, NP  Pt continues in SR, feels well. Can tell when he is in afib as he gets short of breath with inclines, steps. He has not felt that he has had any afib since I saw him last. For now will continue without AAD therapy, but if afib returns will discuss further with pt. He has f/u with Dr. Mayford Knife  in June, return here if notices return of aib.

## 2018-03-27 ENCOUNTER — Encounter (HOSPITAL_BASED_OUTPATIENT_CLINIC_OR_DEPARTMENT_OTHER): Payer: Medicare Other

## 2018-03-30 ENCOUNTER — Other Ambulatory Visit: Payer: Self-pay | Admitting: Cardiology

## 2018-04-07 NOTE — Telephone Encounter (Signed)
Patient called to cancel his test that was scheduled for 03/27/18. Patient he will call back when he is ready to reschedule.

## 2018-04-08 ENCOUNTER — Ambulatory Visit (INDEPENDENT_AMBULATORY_CARE_PROVIDER_SITE_OTHER): Payer: Medicare Other | Admitting: *Deleted

## 2018-04-08 DIAGNOSIS — Z5181 Encounter for therapeutic drug level monitoring: Secondary | ICD-10-CM

## 2018-04-08 DIAGNOSIS — Z7901 Long term (current) use of anticoagulants: Secondary | ICD-10-CM | POA: Diagnosis not present

## 2018-04-08 DIAGNOSIS — I4891 Unspecified atrial fibrillation: Secondary | ICD-10-CM

## 2018-04-08 LAB — POCT INR: INR: 2.5

## 2018-04-08 NOTE — Patient Instructions (Signed)
Description   Continue taking 1 tablet daily except 1/2 tablet on Sundays, Tuesdays and Thursdays. Recheck INR in 4 weeks. Coumadin Clinic [phone]  Main [phone]

## 2018-05-06 ENCOUNTER — Ambulatory Visit (INDEPENDENT_AMBULATORY_CARE_PROVIDER_SITE_OTHER): Payer: Medicare Other | Admitting: *Deleted

## 2018-05-06 DIAGNOSIS — Z5181 Encounter for therapeutic drug level monitoring: Secondary | ICD-10-CM

## 2018-05-06 DIAGNOSIS — I4891 Unspecified atrial fibrillation: Secondary | ICD-10-CM

## 2018-05-06 DIAGNOSIS — Z7901 Long term (current) use of anticoagulants: Secondary | ICD-10-CM | POA: Diagnosis not present

## 2018-05-06 LAB — POCT INR: INR: 2.2 (ref 2.0–3.0)

## 2018-05-06 NOTE — Patient Instructions (Signed)
Description   Continue taking 1 tablet daily except 1/2 tablet on Sundays, Tuesdays and Thursdays. Recheck INR in 4 weeks. Coumadin Clinic [phone]  Main [phone]     

## 2018-06-01 ENCOUNTER — Ambulatory Visit (INDEPENDENT_AMBULATORY_CARE_PROVIDER_SITE_OTHER): Payer: Medicare Other | Admitting: *Deleted

## 2018-06-01 ENCOUNTER — Ambulatory Visit (INDEPENDENT_AMBULATORY_CARE_PROVIDER_SITE_OTHER): Payer: Medicare Other | Admitting: Cardiology

## 2018-06-01 ENCOUNTER — Encounter: Payer: Self-pay | Admitting: Cardiology

## 2018-06-01 VITALS — BP 124/68 | HR 70 | Ht 76.0 in | Wt 234.8 lb

## 2018-06-01 DIAGNOSIS — I480 Paroxysmal atrial fibrillation: Secondary | ICD-10-CM | POA: Diagnosis not present

## 2018-06-01 DIAGNOSIS — Z7901 Long term (current) use of anticoagulants: Secondary | ICD-10-CM | POA: Diagnosis not present

## 2018-06-01 DIAGNOSIS — Z5181 Encounter for therapeutic drug level monitoring: Secondary | ICD-10-CM

## 2018-06-01 DIAGNOSIS — I10 Essential (primary) hypertension: Secondary | ICD-10-CM | POA: Diagnosis not present

## 2018-06-01 DIAGNOSIS — I4891 Unspecified atrial fibrillation: Secondary | ICD-10-CM | POA: Diagnosis not present

## 2018-06-01 DIAGNOSIS — I272 Pulmonary hypertension, unspecified: Secondary | ICD-10-CM | POA: Diagnosis not present

## 2018-06-01 HISTORY — DX: Paroxysmal atrial fibrillation: I48.0

## 2018-06-01 LAB — POCT INR: INR: 2.1 (ref 2.0–3.0)

## 2018-06-01 NOTE — Progress Notes (Signed)
Cardiology Office Note:    Date:  06/01/2018   ID:  Adam Willis, DOB [birth year], MRN [id number]  PCP:  Adam Raring, MD  Cardiologist:  No primary care provider on file.    Referring MD: Adam Raring, MD   Chief Complaint  Patient presents with  . Atrial Fibrillation  . Hypertension    History of Present Illness:    Adam Willis is a 77 y.o. male with a hx of persistent atrial fibrillation with a CHADS2VASC score of 3 on chronic anticoagulation with coumadin (cannot afford NOAC - no insurance), mild pulmonary HTN, polysubstance abuse (cocaine), CKD and HTN.   He was dx with new onset atrial fibrillation and mild trop elevation.  2D echo showed low normal LVF with moderate to severe pulmonary HTN.  He underwent cardiac cath showing mild pulmonary HTN, no CAD and mildly elevated left heart pressures.  He was started on Coumadin and Cardizem for rate control.  He underwent successful cardioversion on 02/19/2018.  Unfortunately follow-up several days later in the office showed atrial flutter with controlled ventricular response.  He was referred to A. fib clinic and was back in sinus rhythm at the time of his appointment.  He is here today for followup and is doing well.  He denies any chest pain or pressure, SOB, DOE, PND, orthopnea, LE edema, dizziness, palpitations or syncope. He is compliant with his meds and is tolerating meds with no SE.      Past Medical History:  Diagnosis Date  . Benign essential HTN 12/31/2017  . Chronic anticoagulation 10/08/2017   CHADs VASc=3- Coumadin (can't afford NOAC)  . Cocaine abuse (HCC) 10/08/2017  . Encounter for therapeutic drug monitoring 02/17/2018  . Hypertension   . Normal coronary arteries 10/08/2017   Cath 10/06/17  . Normocytic normochromic anemia 10/03/2017  . PAF (paroxysmal atrial fibrillation) (HCC) 06/01/2018   s/p DCCV 02/2018  . Pulmonary HTN (HCC) 10/08/2017   moderate to severe by echo but RHC showed mildly  elevated PASP.  PASP by echo felt to be significanly more elevated due to possible effects of cocaine  . Tobacco abuse 10/03/2017    Past Surgical History:  Procedure Laterality Date  . CARDIOVERSION N/A 02/19/2018   Procedure: CARDIOVERSION;  Surgeon: Laurey Morale, MD;  Location: [facility] ENDOSCOPY;  Service: Cardiovascular;  Laterality: N/A;  . RIGHT/LEFT HEART CATH AND CORONARY ANGIOGRAPHY N/A 10/06/2017   Procedure: RIGHT/LEFT HEART CATH AND CORONARY ANGIOGRAPHY;  Surgeon: Laurey Morale, MD;  Location: [facility] INVASIVE CV LAB;  Service: Cardiovascular;  Laterality: N/A;    Current Medications: Current Meds  Medication Sig  . acetaminophen (TYLENOL) 650 MG CR tablet Take 1,300 mg by mouth every 8 (eight) hours as needed for pain.  Marland Kitchen atorvastatin (LIPITOR) 20 MG tablet Take 1 tablet (20 mg total) by mouth daily.  Marland Kitchen diltiazem (CARDIZEM CD) 240 MG 24 hr capsule Take 1 capsule (240 mg total) by mouth daily.  . famotidine-calcium carbonate-magnesium hydroxide (PEPCID COMPLETE) 10-800-165 MG chewable tablet Chew 1 tablet by mouth 2 (two) times daily.  . hydrALAZINE (APRESOLINE) 25 MG tablet Take 1 tablet (25 mg total) by mouth every 8 (eight) hours.  . Menthol, Topical Analgesic, (ABSORBINE JR BACK PATCH EX) Apply 1 patch topically daily as needed (for pain).  Marland Kitchen warfarin (COUMADIN) 5 MG tablet TAKE 1 TABLET BY MOUTH AS DIRECTED     Allergies:   Patient has no known allergies.   Social History   Socioeconomic History  . Marital  status: Single    Spouse name: Not on file  . Number of children: Not on file  . Years of education: Not on file  . Highest education level: Not on file  Occupational History  . Occupation: retired Naval architect with 2 part-time jobs  Social Needs  . Financial resource strain: Not on file  . Food insecurity:    Worry: Not on file    Inability: Not on file  . Transportation needs:    Medical: Not on file    Non-medical: Not on file  Tobacco Use  . Smoking  status: Former Smoker    Years: 10.00    Types: Cigarettes    Last attempt to quit: 10/05/2017    Years since quitting: 0.6  . Smokeless tobacco: Never Used  . Tobacco comment: cut back to 1-2/day in 2016  Substance and Sexual Activity  . Alcohol use: No  . Drug use: Yes    Types: Other-see comments  . Sexual activity: Not on file  Lifestyle  . Physical activity:    Days per week: Not on file    Minutes per session: Not on file  . Stress: Not on file  Relationships  . Social connections:    Talks on phone: Not on file    Gets together: Not on file    Attends religious service: Not on file    Active member of club or organization: Not on file    Attends meetings of clubs or organizations: Not on file    Relationship status: Not on file  Other Topics Concern  . Not on file  Social History Narrative   He lives in [place]. He has a fiance.     Family History: The patient's family history includes Alzheimer's disease (age of onset: 58) in his mother; CAD (age of onset: 11) in his father; Hypertension in his brother.  ROS:   Please see the history of present illness.    ROS  All other systems reviewed and negative.   EKGs/Labs/Other Studies Reviewed:    The following studies were reviewed today: Cardioversion from 02/19/2018  EKG:  EKG is  ordered today.  The ekg ordered today demonstrates atrial fibrillation at 84 bpm with no ST-T wave at normalities.  Recent Labs: 10/03/2017: B Natriuretic Peptide 614.9; TSH 1.275 10/07/2017: ALT 42 10/08/2017: Magnesium 1.9 02/19/2018: BUN 18; Creatinine, Ser 1.21; Hemoglobin 10.1; Platelets 222; Potassium 3.8; Sodium 140   Recent Lipid Panel No results found for: CHOL, TRIG, HDL, CHOLHDL, VLDL, LDLCALC, LDLDIRECT  Physical Exam:    VS:  BP 124/68   Pulse 70   Ht 6\' 4"  (1.93 m)   Wt 234 lb 12.8 oz (106.5 kg)   SpO2 97%   BMI 28.58 kg/m     Wt Readings from Last 3 Encounters:  06/01/18 234 lb 12.8 oz (106.5 kg)  03/05/18  230 lb (104.3 kg)  02/23/18 234 lb (106.1 kg)     GEN:  Well nourished, well developed in no acute distress HEENT: Normal NECK: No JVD; No carotid bruits LYMPHATICS: No lymphadenopathy CARDIAC: RRR, no murmurs, rubs, gallops RESPIRATORY:  Clear to auscultation without rales, wheezing or rhonchi  ABDOMEN: Soft, non-tender, non-distended MUSCULOSKELETAL:  No edema; No deformity  SKIN: Warm and dry NEUROLOGIC:  Alert and oriented x 3 PSYCHIATRIC:  Normal affect   ASSESSMENT:    1. PAF (paroxysmal atrial fibrillation) (HCC)   2. Benign essential HTN   3. Pulmonary HTN (HCC)    PLAN:  In order of problems listed above:  1.  PAF -status post DCCV in March with initial reversion back to atrial flutter but has been maintaining sinus rhythm since then up until today when he appears to be back in atrial fibrillation with controlled ventricular response at 84 bpm.  He is asymptomatic with no palpitations.  He will continue on Cardizem CD 240 mg daily and warfarin.  I am going to refer him back to A. fib clinic for further recommendations.  2.  HTN -BP is well controlled on exam today.  He will continue on diltiazem 240 mg daily and hydralazine 25 mg 3 times daily.  3.  Pulmonary HTN - 2D echocardiogram 10/03/2017 showed moderate to severe pulmonary hypertension with PA systolic pressure 67 mmHg.  The RV was mildly dilated with mild RV systolic dysfunction as well as mildly dilated right atrium.  Subsequent cardiac catheterization on 10/06/2017 showed mildly elevated right heart filling pressures and it was thought that the more elevated PA pressures on initial echo or possibly due to effects of cocaine use.  Medication Adjustments/Labs and Tests Ordered: Current medicines are reviewed at length with the patient today.  Concerns regarding medicines are outlined above.  No orders of the defined types were placed in this encounter.  No orders of the defined types were placed in this  encounter.   Signed, Armanda Magic, MD  06/01/2018 8:38 AM    [facility]

## 2018-06-01 NOTE — Patient Instructions (Signed)
Medication Instructions:  Your physician recommends that you continue on your current medications as directed. Please refer to the Current Medication list given to you today.  If you need a refill on your cardiac medications, please contact your pharmacy first.  Labwork: None ordered   Testing/Procedures: None ordered   Follow-Up: You have been referred to the afib clinic.   Your physician wants you to follow-up in: 1 year with Dr. Mayford Knife. You will receive a reminder letter in the mail two months in advance. If you don't receive a letter, please call our office to schedule the follow-up appointment.  Any Other Special Instructions Will Be Listed Below (If Applicable).   Thank you for choosing [facility]    Lyda Perone, RN  [phone]  If you need a refill on your cardiac medications before your next appointment, please call your pharmacy.

## 2018-06-01 NOTE — Patient Instructions (Signed)
Description   Continue taking 1 tablet daily except 1/2 tablet on Sundays, Tuesdays and Thursdays. Recheck INR in 4 weeks. Coumadin Clinic [phone]  Main [phone]     

## 2018-06-29 ENCOUNTER — Ambulatory Visit (INDEPENDENT_AMBULATORY_CARE_PROVIDER_SITE_OTHER): Payer: Medicare Other | Admitting: *Deleted

## 2018-06-29 ENCOUNTER — Encounter (HOSPITAL_COMMUNITY): Payer: Self-pay | Admitting: Nurse Practitioner

## 2018-06-29 ENCOUNTER — Ambulatory Visit (HOSPITAL_COMMUNITY)
Admission: RE | Admit: 2018-06-29 | Discharge: 2018-06-29 | Disposition: A | Discharge status: Home / Self Care | Payer: Medicare Other | Source: Ambulatory Visit | Attending: Nurse Practitioner | Admitting: Nurse Practitioner

## 2018-06-29 ENCOUNTER — Telehealth: Payer: Self-pay | Admitting: *Deleted

## 2018-06-29 VITALS — BP 130/74 | HR 68 | Ht 76.0 in | Wt 232.8 lb

## 2018-06-29 DIAGNOSIS — Z7983 Long term (current) use of bisphosphonates: Secondary | ICD-10-CM | POA: Diagnosis not present

## 2018-06-29 DIAGNOSIS — Z9889 Other specified postprocedural states: Secondary | ICD-10-CM | POA: Diagnosis not present

## 2018-06-29 DIAGNOSIS — I481 Persistent atrial fibrillation: Secondary | ICD-10-CM

## 2018-06-29 DIAGNOSIS — Z818 Family history of other mental and behavioral disorders: Secondary | ICD-10-CM | POA: Insufficient documentation

## 2018-06-29 DIAGNOSIS — F1410 Cocaine abuse, uncomplicated: Secondary | ICD-10-CM

## 2018-06-29 DIAGNOSIS — Z8249 Family history of ischemic heart disease and other diseases of the circulatory system: Secondary | ICD-10-CM | POA: Insufficient documentation

## 2018-06-29 DIAGNOSIS — G4730 Sleep apnea, unspecified: Secondary | ICD-10-CM | POA: Diagnosis not present

## 2018-06-29 DIAGNOSIS — I4891 Unspecified atrial fibrillation: Secondary | ICD-10-CM

## 2018-06-29 DIAGNOSIS — I272 Pulmonary hypertension, unspecified: Secondary | ICD-10-CM | POA: Diagnosis not present

## 2018-06-29 DIAGNOSIS — Z87891 Personal history of nicotine dependence: Secondary | ICD-10-CM | POA: Diagnosis not present

## 2018-06-29 DIAGNOSIS — Z09 Encounter for follow-up examination after completed treatment for conditions other than malignant neoplasm: Secondary | ICD-10-CM | POA: Diagnosis not present

## 2018-06-29 DIAGNOSIS — Z79899 Other long term (current) drug therapy: Secondary | ICD-10-CM | POA: Diagnosis not present

## 2018-06-29 DIAGNOSIS — Z5181 Encounter for therapeutic drug level monitoring: Secondary | ICD-10-CM

## 2018-06-29 DIAGNOSIS — I10 Essential (primary) hypertension: Secondary | ICD-10-CM | POA: Insufficient documentation

## 2018-06-29 DIAGNOSIS — I480 Paroxysmal atrial fibrillation: Secondary | ICD-10-CM | POA: Insufficient documentation

## 2018-06-29 DIAGNOSIS — Z7901 Long term (current) use of anticoagulants: Secondary | ICD-10-CM

## 2018-06-29 DIAGNOSIS — I4819 Other persistent atrial fibrillation: Secondary | ICD-10-CM

## 2018-06-29 LAB — POCT INR: INR: 2.1 (ref 2.0–3.0)

## 2018-06-29 NOTE — Telephone Encounter (Signed)
-----   Message from Marily Lente, NP sent at 06/29/2018  9:40 AM EDT ----- He is now ready to schedule sleep study, can you help arrange?  Thanks!

## 2018-06-29 NOTE — Patient Instructions (Signed)
Description   Continue taking 1 tablet daily except 1/2 tablet on Sundays, Tuesdays and Thursdays. Recheck INR in 4 weeks. Coumadin Clinic [phone]  Main [phone]     

## 2018-06-29 NOTE — Progress Notes (Signed)
Primary Care Physician: Sherrill Raring, MD Primary Cardiologist: Trason Lefave is a 77 y.o. male with a history of persistent atrial fibrillation who presents for follow up in the [facility].  Since last being seen in clinic, the patient reports doing very well.  Today, he  denies symptoms of palpitations, chest pain, shortness of breath, orthopnea, PND, lower extremity edema, dizziness, presyncope, syncope, snoring, daytime somnolence, bleeding, or neurologic sequela. The patient is tolerating medications without difficulties and is otherwise without complaint today.    Atrial Fibrillation Risk Factors:  he does not have symptoms or diagnosis of sleep apnea.  he does not have a history of rheumatic fever.  he does have a history of alcohol use.  he has a BMI of Body mass index is 28.34 kg/m.Marland Kitchen Filed Weights   06/29/18 0928  Weight: 232 lb 12.8 oz (105.6 kg)    LA size: 51   Atrial Fibrillation Management history:  Previous antiarrhythmic drugs: none  Previous cardioversions: 02/2018  Previous ablations: none  CHADS2VASC score: 4  Anticoagulation history: Warfarin   Past Medical History:  Diagnosis Date  . Benign essential HTN 12/31/2017  . Chronic anticoagulation 10/08/2017   CHADs VASc=3- Coumadin (can't afford NOAC)  . Cocaine abuse (HCC) 10/08/2017  . Encounter for therapeutic drug monitoring 02/17/2018  . Hypertension   . Normal coronary arteries 10/08/2017   Cath 10/06/17  . Normocytic normochromic anemia 10/03/2017  . PAF (paroxysmal atrial fibrillation) (HCC) 06/01/2018   s/p DCCV 02/2018  . Pulmonary HTN (HCC) 10/08/2017   moderate to severe by echo but RHC showed mildly elevated PASP.  PASP by echo felt to be significanly more elevated due to possible effects of cocaine  . Tobacco abuse 10/03/2017   Past Surgical History:  Procedure Laterality Date  . CARDIOVERSION N/A 02/19/2018   Procedure: CARDIOVERSION;   Surgeon: Laurey Morale, MD;  Location: [facility] ENDOSCOPY;  Service: Cardiovascular;  Laterality: N/A;  . RIGHT/LEFT HEART CATH AND CORONARY ANGIOGRAPHY N/A 10/06/2017   Procedure: RIGHT/LEFT HEART CATH AND CORONARY ANGIOGRAPHY;  Surgeon: Laurey Morale, MD;  Location: [facility] INVASIVE CV LAB;  Service: Cardiovascular;  Laterality: N/A;    Current Outpatient Medications  Medication Sig Dispense Refill  . acetaminophen (TYLENOL) 650 MG CR tablet Take 1,300 mg by mouth every 8 (eight) hours as needed for pain.    Marland Kitchen atorvastatin (LIPITOR) 20 MG tablet Take 1 tablet (20 mg total) by mouth daily. 30 tablet 11  . diltiazem (CARDIZEM CD) 240 MG 24 hr capsule Take 1 capsule (240 mg total) by mouth daily. 30 capsule 9  . famotidine-calcium carbonate-magnesium hydroxide (PEPCID COMPLETE) 10-800-165 MG chewable tablet Chew 1 tablet by mouth 2 (two) times daily.    . hydrALAZINE (APRESOLINE) 25 MG tablet Take 1 tablet (25 mg total) by mouth every 8 (eight) hours. 90 tablet 9  . warfarin (COUMADIN) 5 MG tablet TAKE 1 TABLET BY MOUTH AS DIRECTED 30 tablet 2  . Menthol, Topical Analgesic, (ABSORBINE JR BACK PATCH EX) Apply 1 patch topically daily as needed (for pain).     No current facility-administered medications for this encounter.     No Known Allergies  Social History   Socioeconomic History  . Marital status: Single    Spouse name: Not on file  . Number of children: Not on file  . Years of education: Not on file  . Highest education level: Not on file  Occupational History  . 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 Financial resource strain: Not on file  . Food insecurity:    Worry: Not on file    Inability: Not on file  . Transportation needs:    Medical: Not on file    Non-medical: Not on file  Tobacco Use  . Smoking status: Former Smoker    Years: 10.00    Types: Cigarettes    Last attempt to quit: 10/05/2017    Years since quitting: 0.7  . Smokeless  tobacco: Never Used  . Tobacco comment: cut back to 1-2/day in 2016  Substance and Sexual Activity  . Alcohol use: No  . Drug use: Yes    Types: Other-see comments  . Sexual activity: Not on file  Lifestyle  . Physical activity:    Days per week: Not on file    Minutes per session: Not on file  . Stress: Not on file  Relationships  . Social connections:    Talks on phone: Not on file    Gets together: Not on file    Attends religious service: Not on file    Active member of club or organization: Not on file    Attends meetings of clubs or organizations: Not on file    Relationship status: Not on file  . Intimate partner violence:    Fear of current or ex partner: Not on file    Emotionally abused: Not on file    Physically abused: Not on file    Forced sexual activity: Not on file  Other Topics Concern  . Not on file  Social History Narrative   He lives in [place]. He has a fiance.    Family History  Problem Relation Age of Onset  . Hypertension Brother   . Alzheimer's disease Mother 72  . CAD Father 77    ROS- All systems are reviewed and negative except as per the HPI above.  Physical Exam: Vitals:   06/29/18 0928  BP: 130/74  Pulse: 68  Weight: 232 lb 12.8 oz (105.6 kg)  Height: 6\' 4"  (1.93 m)    GEN- The patient is well appearing, alert and oriented x 3 today.   Head- normocephalic, atraumatic Eyes-  Sclera clear, conjunctiva pink Ears- hearing intact Oropharynx- clear Neck- supple  Lungs- Clear to ausculation bilaterally, normal work of breathing Heart- Irregular rate and rhythm  GI- soft, NT, ND, + BS Extremities- no clubbing, cyanosis, or edema MS- no significant deformity or atrophy Skin- no rash or lesion Psych- euthymic mood, full affect Neuro- strength and sensation are intact  Wt Readings from Last 3 Encounters:  06/29/18 232 lb 12.8 oz (105.6 kg)  06/01/18 234 lb 12.8 oz (106.5 kg)  03/05/18 230 lb (104.3 kg)    EKG today  demonstrates atrial fibrillation, rate 68 Echo 09/2017 demonstrated EF 50-55%, no RWMA, mild MR, PA pressure 67, trivial pericardial effusion, LA 51  Epic records are reviewed at length today  Assessment and Plan:  1. Persistent atrial fibrillation Asymptomatic Will plan rate control strategy going forward Continue Warfarin for CHADS2VASC of 4  2. Pulmonary hypertension Moderate to severe by echo, mild by RHC Per Dr Mayford Knife  3. Sleep disordered breathing Sleep study pending He is interested in scheduling, will send message to Dr Norris Cross nurse  4.  Cocaine abuse He reports abstinence  Follow up with Dr Mayford Knife in 3 months. AF clinic as needed  Gypsy Balsam, NP 06/29/2018 9:39 AM

## 2018-07-02 ENCOUNTER — Telehealth: Payer: Self-pay | Admitting: *Deleted

## 2018-07-02 NOTE — Telephone Encounter (Signed)
Split night sent to precert. 

## 2018-07-02 NOTE — Telephone Encounter (Signed)
Staff message sent to [facility]. Patient has Medicare and  A PA is not required for a sleep study. Ok to schedule.

## 2018-07-02 NOTE — Telephone Encounter (Signed)
-----   Message from Reesa Chew, CMA sent at 07/02/2018  3:17 PM EDT ----- Regarding: pre cert Split night ----- Message ----- From: Marily Lente, NP Sent: 06/29/2018   9:40 AM To: Reesa Chew, CMA  He is now ready to schedule sleep study, can you help arrange?  Thanks!

## 2018-07-06 NOTE — Telephone Encounter (Signed)
Patient is scheduled for lab study on 08/04/18. Patient understands he sleep study will be done at [facility] sleep lab. Patient understands he will receive a sleep packet in a week or so. Patient understands to call if he does not receive the sleep packet in a timely manner. Patient agrees with treatment and thanked me for call.

## 2018-07-28 ENCOUNTER — Encounter (INDEPENDENT_AMBULATORY_CARE_PROVIDER_SITE_OTHER): Payer: Self-pay

## 2018-07-28 ENCOUNTER — Ambulatory Visit (INDEPENDENT_AMBULATORY_CARE_PROVIDER_SITE_OTHER): Payer: Medicare Other | Admitting: *Deleted

## 2018-07-28 DIAGNOSIS — I4891 Unspecified atrial fibrillation: Secondary | ICD-10-CM | POA: Diagnosis not present

## 2018-07-28 DIAGNOSIS — Z7901 Long term (current) use of anticoagulants: Secondary | ICD-10-CM

## 2018-07-28 DIAGNOSIS — Z5181 Encounter for therapeutic drug level monitoring: Secondary | ICD-10-CM

## 2018-07-28 LAB — POCT INR: INR: 2.2 (ref 2.0–3.0)

## 2018-07-28 NOTE — Patient Instructions (Signed)
Description   Continue taking 1 tablet daily except 1/2 tablet on Sundays, Tuesdays and Thursdays. Recheck INR in 4 weeks. Coumadin Clinic [phone]  Main [phone]     

## 2018-07-31 ENCOUNTER — Other Ambulatory Visit: Payer: Self-pay | Admitting: Cardiology

## 2018-08-04 ENCOUNTER — Ambulatory Visit (HOSPITAL_BASED_OUTPATIENT_CLINIC_OR_DEPARTMENT_OTHER): Payer: Medicare Other | Attending: Cardiology | Admitting: Cardiology

## 2018-08-04 VITALS — Ht 76.0 in | Wt 230.0 lb

## 2018-08-04 DIAGNOSIS — I4819 Other persistent atrial fibrillation: Secondary | ICD-10-CM

## 2018-08-04 DIAGNOSIS — I4892 Unspecified atrial flutter: Secondary | ICD-10-CM | POA: Insufficient documentation

## 2018-08-04 DIAGNOSIS — R0683 Snoring: Secondary | ICD-10-CM | POA: Insufficient documentation

## 2018-08-04 DIAGNOSIS — G4731 Primary central sleep apnea: Secondary | ICD-10-CM | POA: Insufficient documentation

## 2018-08-04 DIAGNOSIS — G4733 Obstructive sleep apnea (adult) (pediatric): Secondary | ICD-10-CM | POA: Insufficient documentation

## 2018-08-04 DIAGNOSIS — I4891 Unspecified atrial fibrillation: Secondary | ICD-10-CM | POA: Insufficient documentation

## 2018-08-16 NOTE — Procedures (Signed)
   Patient Name: Adam Willis, Adam Willis Date:12/02/2017 08/04/2018 Gender: Male D.O.B: [birth year] Age (years): 89 Referring Provider: Robbie Lis Height (inches): 76 Interpreting Physician: Armanda Magic MD, ABSM Weight (lbs): 230 RPSGT: Shelah Lewandowsky BMI: 28 MRN: [id number] Neck Size: 18.00  CLINICAL INFORMATION Sleep Study Type: NPSG  Indication for sleep study: Hypertension, Snoring  Epworth Sleepiness Score:    SLEEP STUDY TECHNIQUE As per the AASM Manual for the Scoring of Sleep and Associated Events v2.3 (April 2016) with a hypopnea requiring 4% desaturations.  The channels recorded and monitored were frontal, central and occipital EEG, electrooculogram (EOG), submentalis EMG (chin), nasal and oral airflow, thoracic and abdominal wall motion, anterior tibialis EMG, snore microphone, electrocardiogram, and pulse oximetry.  MEDICATIONS Medications self-administered by patient taken the night of the study : N/A  SLEEP ARCHITECTURE The study was initiated at 10:59:15 PM and ended at 5:00:12 AM.  Sleep onset time was 146.2 minutes and the sleep efficiency was 17.2%%. The total sleep time was 62 minutes.  Stage REM latency was 86.0 minutes.  The patient spent 26.6%% of the night in stage N1 sleep, 63.7%% in stage N2 sleep, 0.0%% in stage N3 and 9.7% in REM.  Alpha intrusion was absent.  Supine sleep was 0.00%.  RESPIRATORY PARAMETERS The overall apnea/hypopnea index (AHI) was 42.6 per hour. There were 33 total apneas, including 24 obstructive, 9 central and 0 mixed apneas. There were 11 hypopneas and 20 RERAs.  The AHI during Stage REM sleep was 0.0 per hour.  AHI while supine was N/A per hour.  The mean oxygen saturation was 95.4%. The minimum SpO2 during sleep was 92.0%.  moderate snoring was noted during this study.  CARDIAC DATA The 2 lead EKG demonstrated atrial fibrillation. The mean heart rate was 52.1 beats per minute.   LEG MOVEMENT DATA The  total PLMS were 0 with a resulting PLMS index of 0.0. Associated arousal with leg movement index was 0.0 .  IMPRESSIONS - Severe obstructive sleep apnea occurred during this study (AHI = 42.6/h). - Mild central sleep apnea occurred during this study (CAI = 8.7/h). - The patient had minimal or no oxygen desaturation during the study (Min O2 = 92.0%) - The patient snored with moderate snoring volume. - EKG findings include Atrial Fibrillation - Clinically significant periodic limb movements did not occur during sleep. No significant associated arousals.  DIAGNOSIS - Obstructive Sleep Apnea (327.23 [G47.33 ICD-10]) - Central Sleep Apnea (327.27 [G47.37 ICD-10]))  RECOMMENDATIONS - CPAP titration to determine optimal pressure required to alleviate sleep disordered breathing. BiPAP or ASV titration may be required to eliminate central sleep apnea. - Avoid alcohol, sedatives and other CNS depressants that may worsen sleep apnea and disrupt normal sleep architecture. - Sleep hygiene should be reviewed to assess factors that may improve sleep quality. - Weight management and regular exercise should be initiated or continued if appropriate.  [Electronically signed] 08/16/2018 02:36 PM  Armanda Magic MD, ABSM Diplomate, American Board of Sleep Medicine

## 2018-08-19 ENCOUNTER — Telehealth: Payer: Self-pay | Admitting: *Deleted

## 2018-08-19 DIAGNOSIS — G4730 Sleep apnea, unspecified: Secondary | ICD-10-CM

## 2018-08-19 NOTE — Telephone Encounter (Signed)
Informed patient of sleep study results and patient understanding was verbalized. Patient understands his sleep study showed they have sleep apnea and recommend CPAP titration. Pt is aware and agreeable to sleep results.

## 2018-08-19 NOTE — Telephone Encounter (Signed)
-----   Message from Quintella Reichert, MD sent at 08/16/2018  2:42 PM EDT ----- Please let patient know that they have sleep apnea and recommend CPAP titration. Please set up titration in the sleep lab.

## 2018-08-19 NOTE — Telephone Encounter (Deleted)
cpap titration sent to precert 

## 2018-08-26 ENCOUNTER — Ambulatory Visit (INDEPENDENT_AMBULATORY_CARE_PROVIDER_SITE_OTHER): Payer: Medicare Other | Admitting: *Deleted

## 2018-08-26 DIAGNOSIS — Z5181 Encounter for therapeutic drug level monitoring: Secondary | ICD-10-CM | POA: Diagnosis not present

## 2018-08-26 DIAGNOSIS — I4891 Unspecified atrial fibrillation: Secondary | ICD-10-CM | POA: Diagnosis not present

## 2018-08-26 DIAGNOSIS — Z7901 Long term (current) use of anticoagulants: Secondary | ICD-10-CM | POA: Diagnosis not present

## 2018-08-26 LAB — POCT INR: INR: 2.9 (ref 2.0–3.0)

## 2018-08-26 NOTE — Patient Instructions (Signed)
Description   Continue taking 1 tablet daily except 1/2 tablet on Sundays, Tuesdays and Thursdays. Recheck INR in 4 weeks. Coumadin Clinic [phone]  Main [phone]     

## 2018-08-28 ENCOUNTER — Encounter: Payer: Self-pay | Admitting: *Deleted

## 2018-08-28 ENCOUNTER — Telehealth: Payer: Self-pay | Admitting: *Deleted

## 2018-08-28 NOTE — Telephone Encounter (Signed)
Patient is scheduled for CPAP Titration on 09/26/18. Patient understands his titration study will be done at [facility] sleep lab. Patient understands he will receive a letter in a week or so detailing appointment, date, time, and location. Patient understands to call if he does not receive the letter  in a timely manner. Patient agrees with treatment and thanked me for call.

## 2018-08-29 DIAGNOSIS — T22111A Burn of first degree of right forearm, initial encounter: Secondary | ICD-10-CM | POA: Diagnosis not present

## 2018-08-29 DIAGNOSIS — Z23 Encounter for immunization: Secondary | ICD-10-CM | POA: Diagnosis not present

## 2018-08-29 DIAGNOSIS — Y998 Other external cause status: Secondary | ICD-10-CM | POA: Diagnosis not present

## 2018-08-29 DIAGNOSIS — T310 Burns involving less than 10% of body surface: Secondary | ICD-10-CM | POA: Diagnosis not present

## 2018-08-29 DIAGNOSIS — X16XXXA Contact with hot heating appliances, radiators and pipes, initial encounter: Secondary | ICD-10-CM | POA: Diagnosis not present

## 2018-08-29 DIAGNOSIS — T22211A Burn of second degree of right forearm, initial encounter: Secondary | ICD-10-CM | POA: Diagnosis not present

## 2018-09-06 DIAGNOSIS — L723 Sebaceous cyst: Secondary | ICD-10-CM | POA: Diagnosis not present

## 2018-09-08 ENCOUNTER — Other Ambulatory Visit: Payer: Self-pay | Admitting: Physician Assistant

## 2018-09-26 ENCOUNTER — Ambulatory Visit (HOSPITAL_BASED_OUTPATIENT_CLINIC_OR_DEPARTMENT_OTHER): Payer: Medicare Other | Admitting: Cardiology

## 2018-10-15 ENCOUNTER — Ambulatory Visit (INDEPENDENT_AMBULATORY_CARE_PROVIDER_SITE_OTHER): Payer: Medicare Other | Admitting: *Deleted

## 2018-10-15 DIAGNOSIS — Z7901 Long term (current) use of anticoagulants: Secondary | ICD-10-CM | POA: Diagnosis not present

## 2018-10-15 DIAGNOSIS — Z5181 Encounter for therapeutic drug level monitoring: Secondary | ICD-10-CM

## 2018-10-15 LAB — POCT INR: INR: 1.9 — AB (ref 2.0–3.0)

## 2018-10-15 NOTE — Patient Instructions (Addendum)
Description   Today take 1 tablet then continue taking 1 tablet daily except 1/2 tablet on Sundays, Tuesdays and Thursdays. Recheck INR in 3 weeks. Coumadin Clinic [phone]  Main [phone]

## 2018-10-18 ENCOUNTER — Ambulatory Visit (HOSPITAL_BASED_OUTPATIENT_CLINIC_OR_DEPARTMENT_OTHER): Payer: Medicare Other | Attending: Cardiology

## 2018-10-19 ENCOUNTER — Other Ambulatory Visit: Payer: Self-pay | Admitting: Physician Assistant

## 2018-11-05 ENCOUNTER — Ambulatory Visit (INDEPENDENT_AMBULATORY_CARE_PROVIDER_SITE_OTHER): Payer: Medicare Other | Admitting: *Deleted

## 2018-11-05 DIAGNOSIS — Z5181 Encounter for therapeutic drug level monitoring: Secondary | ICD-10-CM

## 2018-11-05 DIAGNOSIS — Z7901 Long term (current) use of anticoagulants: Secondary | ICD-10-CM

## 2018-11-05 LAB — POCT INR: INR: 2.1 (ref 2.0–3.0)

## 2018-11-05 NOTE — Patient Instructions (Signed)
Description   Continue taking 1 tablet daily except 1/2 tablet on Sundays, Tuesdays and Thursdays. Recheck INR in 4 weeks. Coumadin Clinic [phone]  Main [phone]     

## 2018-11-17 DIAGNOSIS — J029 Acute pharyngitis, unspecified: Secondary | ICD-10-CM | POA: Diagnosis not present

## 2018-11-17 DIAGNOSIS — Z87891 Personal history of nicotine dependence: Secondary | ICD-10-CM | POA: Diagnosis not present

## 2018-12-06 DIAGNOSIS — I4891 Unspecified atrial fibrillation: Secondary | ICD-10-CM | POA: Diagnosis not present

## 2018-12-06 DIAGNOSIS — I110 Hypertensive heart disease with heart failure: Secondary | ICD-10-CM | POA: Diagnosis not present

## 2018-12-06 DIAGNOSIS — Z87891 Personal history of nicotine dependence: Secondary | ICD-10-CM | POA: Diagnosis not present

## 2018-12-06 DIAGNOSIS — Z23 Encounter for immunization: Secondary | ICD-10-CM | POA: Diagnosis not present

## 2018-12-06 DIAGNOSIS — I088 Other rheumatic multiple valve diseases: Secondary | ICD-10-CM | POA: Diagnosis not present

## 2018-12-06 DIAGNOSIS — R369 Urethral discharge, unspecified: Secondary | ICD-10-CM | POA: Diagnosis not present

## 2018-12-06 DIAGNOSIS — Z79899 Other long term (current) drug therapy: Secondary | ICD-10-CM | POA: Diagnosis not present

## 2018-12-06 DIAGNOSIS — R001 Bradycardia, unspecified: Secondary | ICD-10-CM | POA: Diagnosis not present

## 2018-12-06 DIAGNOSIS — I482 Chronic atrial fibrillation, unspecified: Secondary | ICD-10-CM | POA: Diagnosis not present

## 2018-12-06 DIAGNOSIS — I272 Pulmonary hypertension, unspecified: Secondary | ICD-10-CM | POA: Diagnosis not present

## 2018-12-06 DIAGNOSIS — I5022 Chronic systolic (congestive) heart failure: Secondary | ICD-10-CM | POA: Diagnosis not present

## 2018-12-06 DIAGNOSIS — R7989 Other specified abnormal findings of blood chemistry: Secondary | ICD-10-CM | POA: Diagnosis not present

## 2018-12-06 DIAGNOSIS — Z7901 Long term (current) use of anticoagulants: Secondary | ICD-10-CM | POA: Diagnosis not present

## 2018-12-06 DIAGNOSIS — R0789 Other chest pain: Secondary | ICD-10-CM | POA: Diagnosis not present

## 2018-12-06 DIAGNOSIS — R0602 Shortness of breath: Secondary | ICD-10-CM | POA: Diagnosis not present

## 2018-12-07 DIAGNOSIS — Z7901 Long term (current) use of anticoagulants: Secondary | ICD-10-CM | POA: Diagnosis not present

## 2018-12-07 DIAGNOSIS — R001 Bradycardia, unspecified: Secondary | ICD-10-CM | POA: Diagnosis not present

## 2018-12-07 DIAGNOSIS — I272 Pulmonary hypertension, unspecified: Secondary | ICD-10-CM | POA: Diagnosis not present

## 2018-12-07 DIAGNOSIS — I4891 Unspecified atrial fibrillation: Secondary | ICD-10-CM | POA: Diagnosis not present

## 2018-12-07 DIAGNOSIS — I081 Rheumatic disorders of both mitral and tricuspid valves: Secondary | ICD-10-CM | POA: Diagnosis not present

## 2018-12-07 DIAGNOSIS — I371 Nonrheumatic pulmonary valve insufficiency: Secondary | ICD-10-CM | POA: Diagnosis not present

## 2018-12-18 DIAGNOSIS — I4891 Unspecified atrial fibrillation: Secondary | ICD-10-CM | POA: Diagnosis not present

## 2018-12-18 DIAGNOSIS — Z7901 Long term (current) use of anticoagulants: Secondary | ICD-10-CM | POA: Diagnosis not present

## 2018-12-18 DIAGNOSIS — I5043 Acute on chronic combined systolic (congestive) and diastolic (congestive) heart failure: Secondary | ICD-10-CM | POA: Diagnosis not present

## 2018-12-18 DIAGNOSIS — R001 Bradycardia, unspecified: Secondary | ICD-10-CM | POA: Diagnosis not present

## 2018-12-18 DIAGNOSIS — G4733 Obstructive sleep apnea (adult) (pediatric): Secondary | ICD-10-CM | POA: Diagnosis not present

## 2018-12-21 DIAGNOSIS — Z7251 High risk heterosexual behavior: Secondary | ICD-10-CM | POA: Diagnosis not present

## 2018-12-21 DIAGNOSIS — Z0000 Encounter for general adult medical examination without abnormal findings: Secondary | ICD-10-CM | POA: Diagnosis not present

## 2018-12-21 DIAGNOSIS — Z1331 Encounter for screening for depression: Secondary | ICD-10-CM | POA: Diagnosis not present

## 2018-12-21 DIAGNOSIS — R0602 Shortness of breath: Secondary | ICD-10-CM | POA: Diagnosis not present

## 2018-12-21 DIAGNOSIS — I4891 Unspecified atrial fibrillation: Secondary | ICD-10-CM | POA: Diagnosis not present

## 2018-12-21 DIAGNOSIS — Z5181 Encounter for therapeutic drug level monitoring: Secondary | ICD-10-CM | POA: Diagnosis not present

## 2018-12-21 DIAGNOSIS — E559 Vitamin D deficiency, unspecified: Secondary | ICD-10-CM | POA: Diagnosis not present

## 2018-12-21 DIAGNOSIS — Z131 Encounter for screening for diabetes mellitus: Secondary | ICD-10-CM | POA: Diagnosis not present

## 2018-12-21 DIAGNOSIS — Z7901 Long term (current) use of anticoagulants: Secondary | ICD-10-CM | POA: Diagnosis not present

## 2018-12-21 DIAGNOSIS — Z1339 Encounter for screening examination for other mental health and behavioral disorders: Secondary | ICD-10-CM | POA: Diagnosis not present

## 2018-12-21 DIAGNOSIS — R9431 Abnormal electrocardiogram [ECG] [EKG]: Secondary | ICD-10-CM | POA: Diagnosis not present

## 2018-12-30 DIAGNOSIS — Z7901 Long term (current) use of anticoagulants: Secondary | ICD-10-CM | POA: Diagnosis not present

## 2018-12-30 DIAGNOSIS — Z5181 Encounter for therapeutic drug level monitoring: Secondary | ICD-10-CM | POA: Diagnosis not present

## 2018-12-30 DIAGNOSIS — I4891 Unspecified atrial fibrillation: Secondary | ICD-10-CM | POA: Diagnosis not present

## 2019-01-13 DIAGNOSIS — Z7901 Long term (current) use of anticoagulants: Secondary | ICD-10-CM | POA: Diagnosis not present

## 2019-01-13 DIAGNOSIS — Z5181 Encounter for therapeutic drug level monitoring: Secondary | ICD-10-CM | POA: Diagnosis not present

## 2019-01-13 DIAGNOSIS — I4891 Unspecified atrial fibrillation: Secondary | ICD-10-CM | POA: Diagnosis not present

## 2019-02-15 ENCOUNTER — Telehealth: Payer: Self-pay

## 2019-02-15 DIAGNOSIS — I4891 Unspecified atrial fibrillation: Secondary | ICD-10-CM | POA: Diagnosis not present

## 2019-02-15 DIAGNOSIS — Z7901 Long term (current) use of anticoagulants: Secondary | ICD-10-CM | POA: Diagnosis not present

## 2019-02-15 DIAGNOSIS — Z5181 Encounter for therapeutic drug level monitoring: Secondary | ICD-10-CM | POA: Diagnosis not present

## 2019-02-15 NOTE — Telephone Encounter (Signed)
Called to let the pt know that they are overdue for their coumadin check. The pt hung up on me

## 2019-03-23 DIAGNOSIS — I4891 Unspecified atrial fibrillation: Secondary | ICD-10-CM | POA: Diagnosis not present

## 2019-03-23 DIAGNOSIS — Z5181 Encounter for therapeutic drug level monitoring: Secondary | ICD-10-CM | POA: Diagnosis not present

## 2019-03-23 DIAGNOSIS — R791 Abnormal coagulation profile: Secondary | ICD-10-CM | POA: Diagnosis not present

## 2019-03-23 DIAGNOSIS — Z7901 Long term (current) use of anticoagulants: Secondary | ICD-10-CM | POA: Diagnosis not present

## 2019-03-30 IMAGING — CT CT ANGIO CHEST
2 of 7 series · 19 of 46 positions shown · IV contrast (APPLIED)
Comparison: None.

CLINICAL DATA: Shortness of breath for the past 2 days.  No pain.

EXAM:
CT ANGIOGRAPHY CHEST WITH CONTRAST
TECHNIQUE: Multidetector CT imaging of the chest was performed using the
standard protocol during bolus administration of intravenous
contrast. Multiplanar CT image reconstructions and MIPs were
obtained to evaluate the vascular anatomy.
CONTRAST:  80 mL Isovue 370

[Series 7: thins · axial · 0.66mm/px · z∈[+1202,+1468]mm · 16 of 428 slices shown]
[im 24/428  lung]
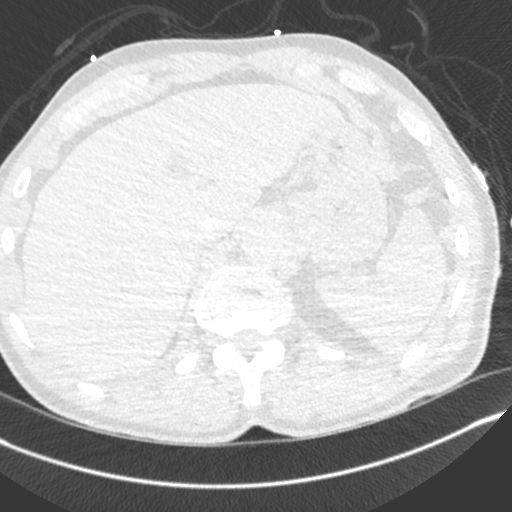
[im 48/428  soft-tissue]
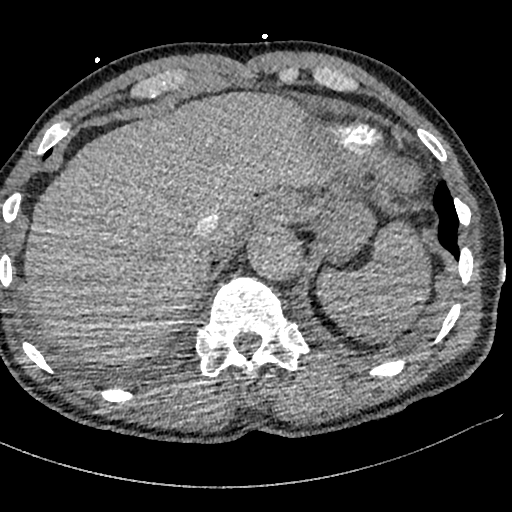
[im 72/428  lung]
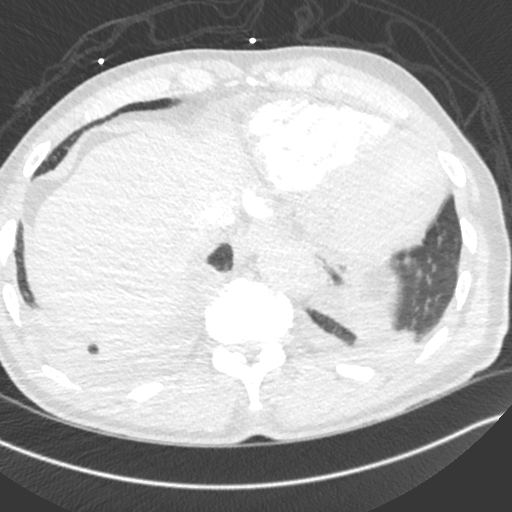
[im 95/428  soft-tissue]
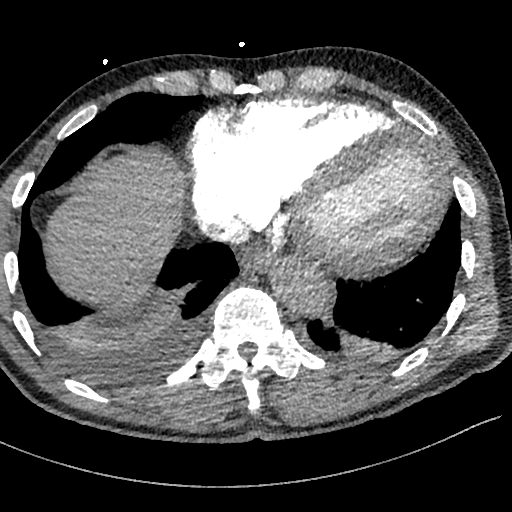
[im 119/428  lung]
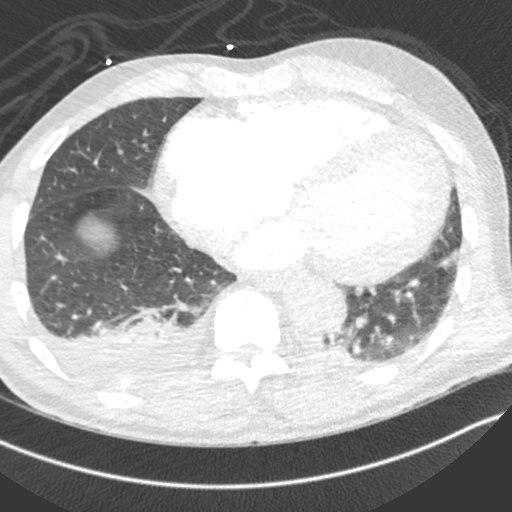
[im 143/428  soft-tissue]
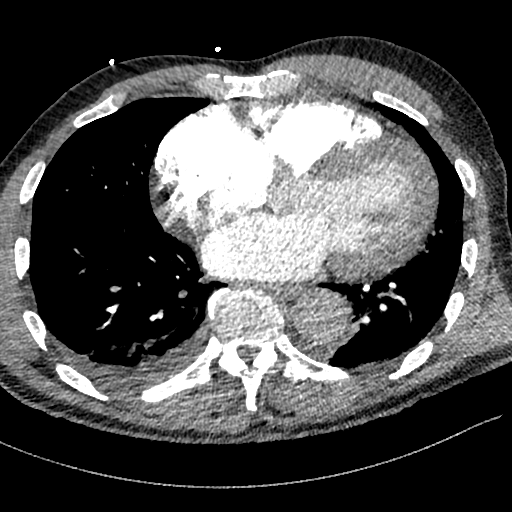
[im 167/428  lung]
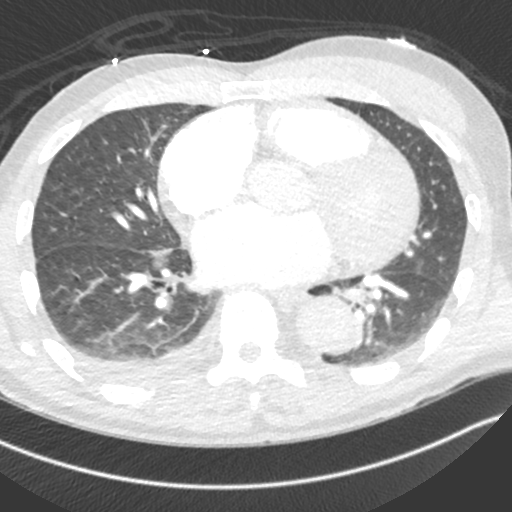
[im 190/428  soft-tissue]
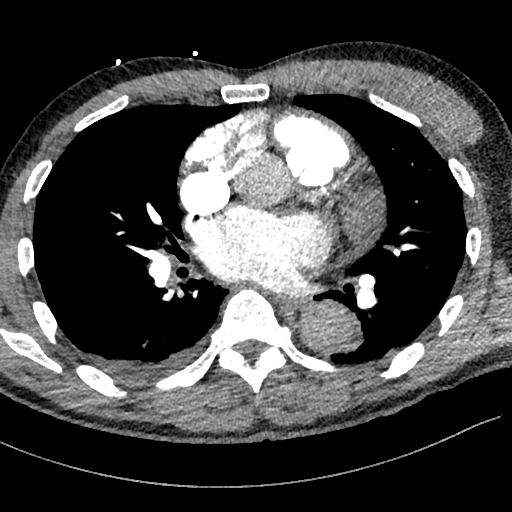
[im 238/428  lung]
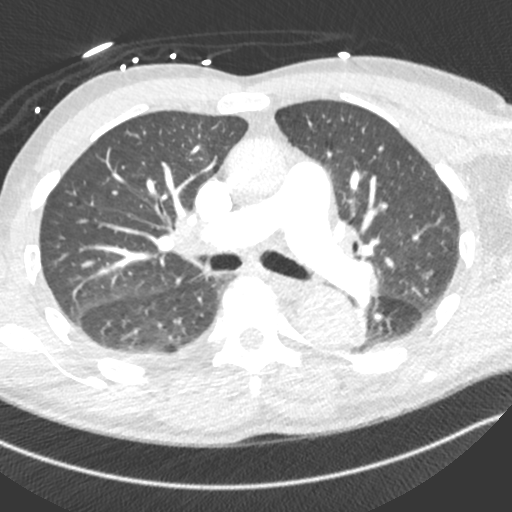
[im 261/428  soft-tissue]
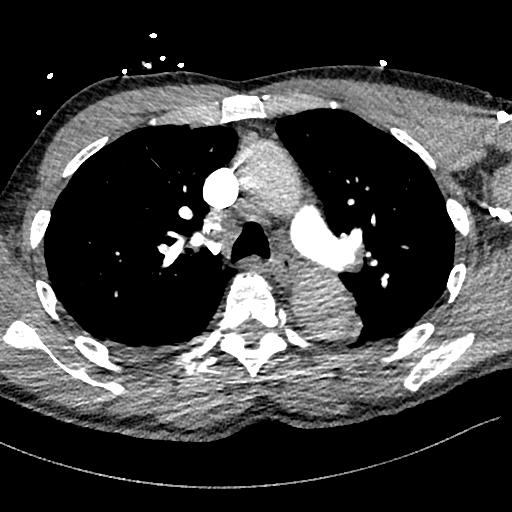
[im 285/428  lung]
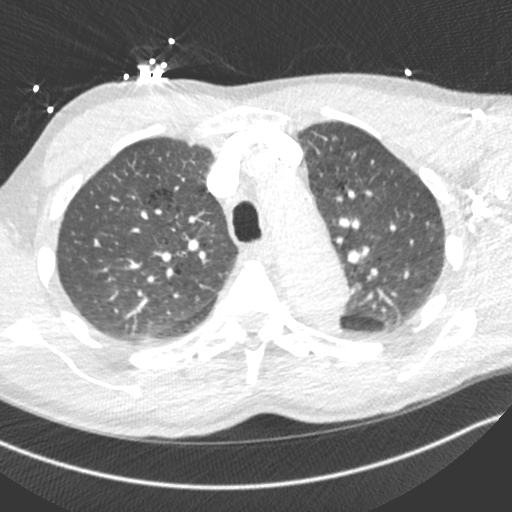
[im 309/428  soft-tissue]
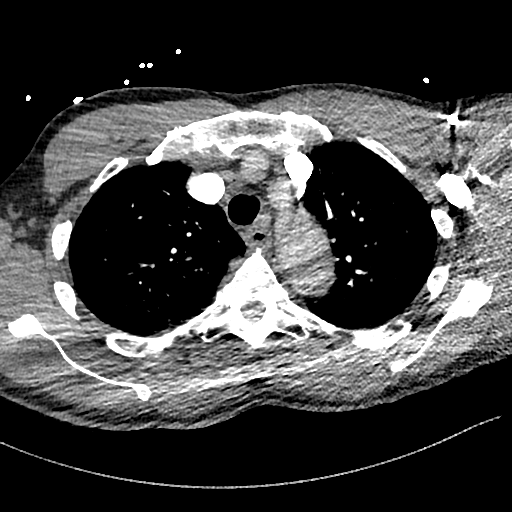
[im 333/428  lung]
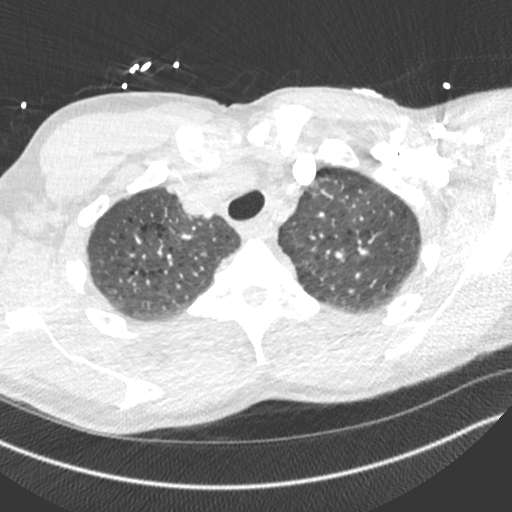
[im 356/428  soft-tissue]
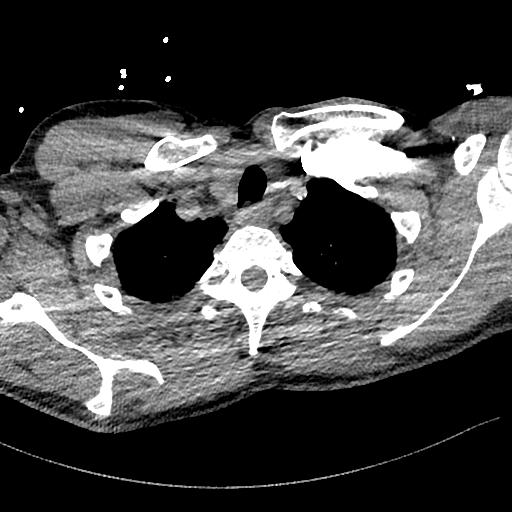
[im 380/428  lung]
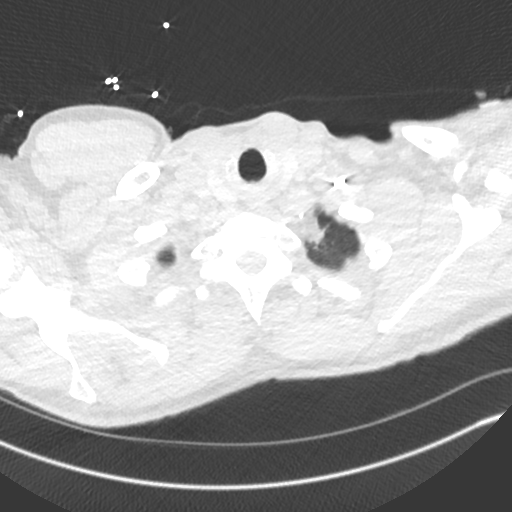
[im 404/428  soft-tissue]
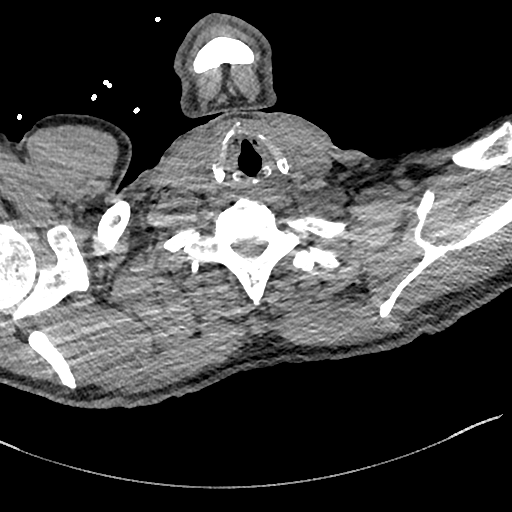

[Series 8: cor · coronal · 0.61mm/px · 3 of 110 slices shown]
[im 28/110  soft-tissue]
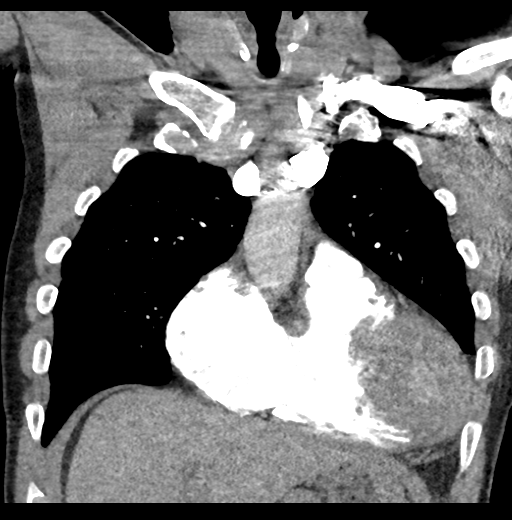
[im 55/110  soft-tissue]
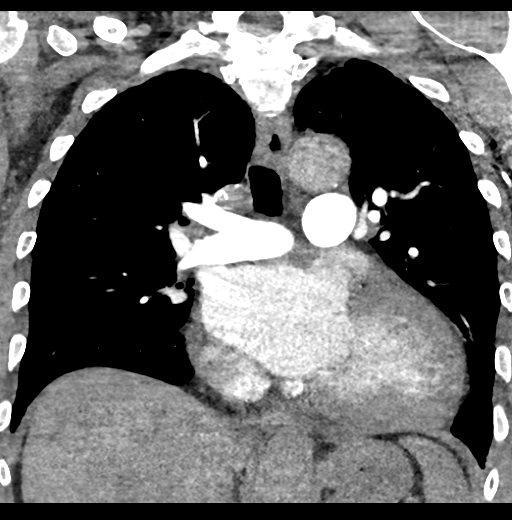
[im 82/110  soft-tissue]
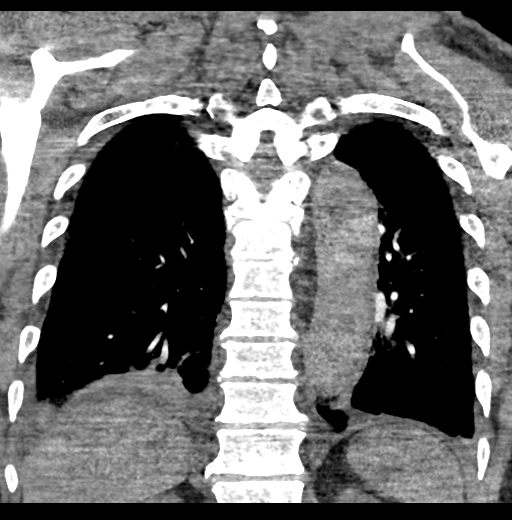

[19 of 46 positions shown; findings below may reference images not displayed]

FINDINGS: Cardiovascular: Satisfactory opacification of the pulmonary arteries
to the segmental level. No evidence of pulmonary embolism. Stable
cardiomegaly. No pericardial effusion. Normal caliber thoracic
aorta. No thoracic aortic aneurysm.

Mediastinum/Nodes: No enlarged mediastinal, hilar, or axillary lymph
nodes. Thyroid gland, trachea, and esophagus demonstrate no
significant findings.

Lungs/Pleura: Small right pleural effusion. Bibasilar dependent
atelectasis. Bilateral centrilobular emphysema in the upper lobes.
No pneumothorax.

Upper Abdomen: No acute abnormality.

Musculoskeletal: No chest wall abnormality. No acute or significant
osseous findings.

Review of the MIP images confirms the above findings.
IMPRESSION: 1. No evidence of pulmonary embolus.
2. Small right pleural effusion.  Stable cardiomegaly.

## 2019-03-31 IMAGING — DX DG CHEST 1V PORT SAME DAY
1 series · 1 of 1 positions shown · non-contrast
Comparison: 10/03/2017.

CLINICAL DATA: Atrial fibrillation.

EXAM:
PORTABLE CHEST 1 VIEW

[chest ap]
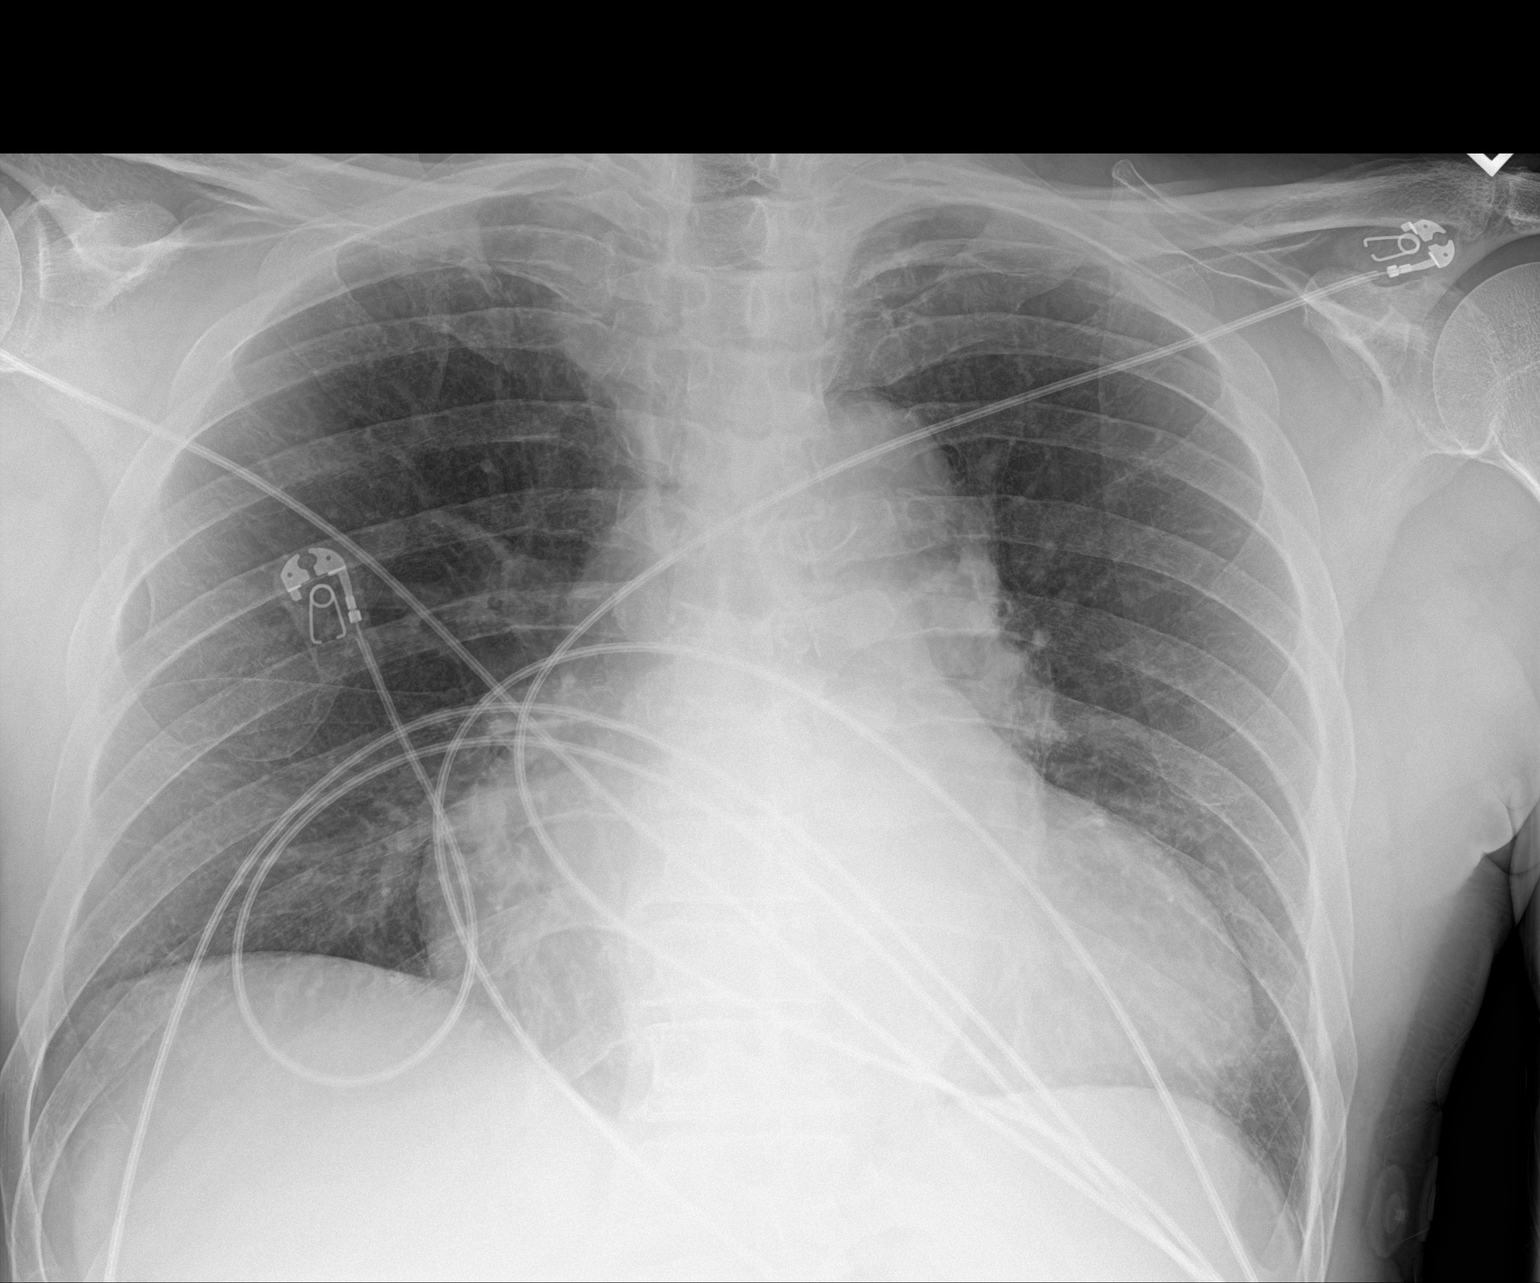

[1 of 1 positions shown; findings below may reference images not displayed]

FINDINGS: The heart remains enlarged. Interstitial prominence represent
pulmonary edema is improved compared with priors. No consolidation,
effusion, or pneumothorax. Thoracic atherosclerosis.
IMPRESSION: Interstitial prominence representing pulmonary edema is improved
compared with priors. No change cardiomegaly.

## 2019-04-02 ENCOUNTER — Other Ambulatory Visit: Payer: Self-pay | Admitting: Cardiology

## 2019-04-12 DIAGNOSIS — R791 Abnormal coagulation profile: Secondary | ICD-10-CM | POA: Diagnosis not present

## 2019-04-12 DIAGNOSIS — Z7901 Long term (current) use of anticoagulants: Secondary | ICD-10-CM | POA: Diagnosis not present

## 2019-04-12 DIAGNOSIS — I4891 Unspecified atrial fibrillation: Secondary | ICD-10-CM | POA: Diagnosis not present

## 2019-04-12 DIAGNOSIS — Z5181 Encounter for therapeutic drug level monitoring: Secondary | ICD-10-CM | POA: Diagnosis not present

## 2019-05-04 DIAGNOSIS — R791 Abnormal coagulation profile: Secondary | ICD-10-CM | POA: Diagnosis not present

## 2019-05-04 DIAGNOSIS — Z5181 Encounter for therapeutic drug level monitoring: Secondary | ICD-10-CM | POA: Diagnosis not present

## 2019-05-04 DIAGNOSIS — I4891 Unspecified atrial fibrillation: Secondary | ICD-10-CM | POA: Diagnosis not present

## 2019-05-04 DIAGNOSIS — Z7901 Long term (current) use of anticoagulants: Secondary | ICD-10-CM | POA: Diagnosis not present

## 2019-05-17 DIAGNOSIS — I4891 Unspecified atrial fibrillation: Secondary | ICD-10-CM | POA: Diagnosis not present

## 2019-05-17 DIAGNOSIS — Z5181 Encounter for therapeutic drug level monitoring: Secondary | ICD-10-CM | POA: Diagnosis not present

## 2019-05-17 DIAGNOSIS — Z7901 Long term (current) use of anticoagulants: Secondary | ICD-10-CM | POA: Diagnosis not present

## 2019-05-17 DIAGNOSIS — R791 Abnormal coagulation profile: Secondary | ICD-10-CM | POA: Diagnosis not present

## 2019-05-31 DIAGNOSIS — Z5181 Encounter for therapeutic drug level monitoring: Secondary | ICD-10-CM | POA: Diagnosis not present

## 2019-05-31 DIAGNOSIS — Z7901 Long term (current) use of anticoagulants: Secondary | ICD-10-CM | POA: Diagnosis not present

## 2019-05-31 DIAGNOSIS — I4891 Unspecified atrial fibrillation: Secondary | ICD-10-CM | POA: Diagnosis not present

## 2019-06-09 DIAGNOSIS — M109 Gout, unspecified: Secondary | ICD-10-CM | POA: Diagnosis not present

## 2019-06-28 DIAGNOSIS — Z5181 Encounter for therapeutic drug level monitoring: Secondary | ICD-10-CM | POA: Diagnosis not present

## 2019-06-28 DIAGNOSIS — Z7901 Long term (current) use of anticoagulants: Secondary | ICD-10-CM | POA: Diagnosis not present

## 2019-06-28 DIAGNOSIS — I4891 Unspecified atrial fibrillation: Secondary | ICD-10-CM | POA: Diagnosis not present

## 2019-06-29 ENCOUNTER — Other Ambulatory Visit: Payer: Self-pay | Admitting: Cardiology

## 2019-07-16 ENCOUNTER — Other Ambulatory Visit: Payer: Self-pay | Admitting: Physician Assistant

## 2019-07-16 ENCOUNTER — Other Ambulatory Visit: Payer: Self-pay | Admitting: Cardiology

## 2019-07-17 ENCOUNTER — Other Ambulatory Visit: Payer: Self-pay | Admitting: Cardiology

## 2019-07-20 ENCOUNTER — Other Ambulatory Visit: Payer: Self-pay | Admitting: Cardiology

## 2019-07-21 ENCOUNTER — Other Ambulatory Visit: Payer: Self-pay | Admitting: Cardiology

## 2019-07-26 DIAGNOSIS — Z5181 Encounter for therapeutic drug level monitoring: Secondary | ICD-10-CM | POA: Diagnosis not present

## 2019-07-26 DIAGNOSIS — I4891 Unspecified atrial fibrillation: Secondary | ICD-10-CM | POA: Diagnosis not present

## 2019-07-26 DIAGNOSIS — Z7901 Long term (current) use of anticoagulants: Secondary | ICD-10-CM | POA: Diagnosis not present

## 2019-07-28 ENCOUNTER — Other Ambulatory Visit: Payer: Self-pay | Admitting: Physician Assistant

## 2019-07-28 ENCOUNTER — Other Ambulatory Visit: Payer: Self-pay | Admitting: Cardiology

## 2019-08-10 DIAGNOSIS — I428 Other cardiomyopathies: Secondary | ICD-10-CM | POA: Diagnosis not present

## 2019-08-10 DIAGNOSIS — I2510 Atherosclerotic heart disease of native coronary artery without angina pectoris: Secondary | ICD-10-CM | POA: Diagnosis not present

## 2019-08-10 DIAGNOSIS — I4891 Unspecified atrial fibrillation: Secondary | ICD-10-CM | POA: Diagnosis not present

## 2019-08-10 DIAGNOSIS — R0602 Shortness of breath: Secondary | ICD-10-CM | POA: Diagnosis not present

## 2019-08-10 DIAGNOSIS — R9431 Abnormal electrocardiogram [ECG] [EKG]: Secondary | ICD-10-CM | POA: Diagnosis not present

## 2019-08-12 DIAGNOSIS — R0602 Shortness of breath: Secondary | ICD-10-CM | POA: Diagnosis not present

## 2019-08-16 DIAGNOSIS — I739 Peripheral vascular disease, unspecified: Secondary | ICD-10-CM | POA: Diagnosis not present

## 2019-08-16 DIAGNOSIS — R0989 Other specified symptoms and signs involving the circulatory and respiratory systems: Secondary | ICD-10-CM | POA: Diagnosis not present

## 2019-08-16 DIAGNOSIS — M79606 Pain in leg, unspecified: Secondary | ICD-10-CM | POA: Diagnosis not present

## 2019-08-16 DIAGNOSIS — R079 Chest pain, unspecified: Secondary | ICD-10-CM | POA: Diagnosis not present

## 2019-08-16 DIAGNOSIS — R0602 Shortness of breath: Secondary | ICD-10-CM | POA: Diagnosis not present

## 2019-08-30 DIAGNOSIS — R791 Abnormal coagulation profile: Secondary | ICD-10-CM | POA: Diagnosis not present

## 2019-08-30 DIAGNOSIS — Z5181 Encounter for therapeutic drug level monitoring: Secondary | ICD-10-CM | POA: Diagnosis not present

## 2019-08-30 DIAGNOSIS — I4891 Unspecified atrial fibrillation: Secondary | ICD-10-CM | POA: Diagnosis not present

## 2019-08-30 DIAGNOSIS — Z7901 Long term (current) use of anticoagulants: Secondary | ICD-10-CM | POA: Diagnosis not present

## 2019-09-13 DIAGNOSIS — R791 Abnormal coagulation profile: Secondary | ICD-10-CM | POA: Diagnosis not present

## 2019-09-13 DIAGNOSIS — Z7901 Long term (current) use of anticoagulants: Secondary | ICD-10-CM | POA: Diagnosis not present

## 2019-09-13 DIAGNOSIS — Z5181 Encounter for therapeutic drug level monitoring: Secondary | ICD-10-CM | POA: Diagnosis not present

## 2019-09-13 DIAGNOSIS — I4891 Unspecified atrial fibrillation: Secondary | ICD-10-CM | POA: Diagnosis not present

## 2019-09-17 DIAGNOSIS — Z299 Encounter for prophylactic measures, unspecified: Secondary | ICD-10-CM | POA: Diagnosis not present

## 2019-09-17 DIAGNOSIS — M10041 Idiopathic gout, right hand: Secondary | ICD-10-CM | POA: Diagnosis not present

## 2019-09-17 DIAGNOSIS — M79641 Pain in right hand: Secondary | ICD-10-CM | POA: Diagnosis not present

## 2019-09-17 DIAGNOSIS — M778 Other enthesopathies, not elsewhere classified: Secondary | ICD-10-CM | POA: Diagnosis not present

## 2019-09-22 DIAGNOSIS — I4891 Unspecified atrial fibrillation: Secondary | ICD-10-CM | POA: Diagnosis not present

## 2019-09-26 ENCOUNTER — Other Ambulatory Visit: Payer: Self-pay | Admitting: Physician Assistant

## 2023-09-16 DEATH — deceased
